# Patient Record
Sex: Female | Born: 1970 | Race: Black or African American | Hispanic: No | Marital: Single | Smoking: Never smoker
Health system: Southern US, Community
[De-identification: ages and names within clinical notes are randomized; demographics above are authoritative.]

## PROBLEM LIST (undated history)

## (undated) DIAGNOSIS — G459 Transient cerebral ischemic attack, unspecified: Secondary | ICD-10-CM

## (undated) DIAGNOSIS — R569 Unspecified convulsions: Secondary | ICD-10-CM

## (undated) DIAGNOSIS — E7800 Pure hypercholesterolemia, unspecified: Secondary | ICD-10-CM

## (undated) DIAGNOSIS — R519 Headache, unspecified: Secondary | ICD-10-CM

## (undated) DIAGNOSIS — I639 Cerebral infarction, unspecified: Secondary | ICD-10-CM

## (undated) DIAGNOSIS — R51 Headache: Secondary | ICD-10-CM

## (undated) DIAGNOSIS — G4730 Sleep apnea, unspecified: Secondary | ICD-10-CM

## (undated) DIAGNOSIS — I10 Essential (primary) hypertension: Secondary | ICD-10-CM

## (undated) HISTORY — PX: SHOULDER ARTHROSCOPY W/ ROTATOR CUFF REPAIR: SHX2400

## (undated) HISTORY — DX: Sleep apnea, unspecified: G47.30

## (undated) HISTORY — DX: Unspecified convulsions: R56.9

## (undated) HISTORY — DX: Cerebral infarction, unspecified: I63.9

---

## 2004-08-16 ENCOUNTER — Ambulatory Visit: Payer: Self-pay | Admitting: Family Medicine

## 2005-02-11 ENCOUNTER — Ambulatory Visit: Payer: Self-pay | Admitting: Family Medicine

## 2005-02-11 LAB — CONVERTED CEMR LAB: Pap Smear: NORMAL

## 2005-03-11 ENCOUNTER — Emergency Department (HOSPITAL_COMMUNITY): Admission: EM | Admit: 2005-03-11 | Discharge: 2005-03-11 | Payer: Self-pay | Admitting: Emergency Medicine

## 2005-09-24 ENCOUNTER — Inpatient Hospital Stay (HOSPITAL_COMMUNITY): Admission: AD | Admit: 2005-09-24 | Discharge: 2005-09-25 | Payer: Self-pay | Admitting: *Deleted

## 2005-09-25 ENCOUNTER — Ambulatory Visit: Payer: Self-pay | Admitting: Family Medicine

## 2005-09-26 ENCOUNTER — Ambulatory Visit (HOSPITAL_COMMUNITY): Admission: RE | Admit: 2005-09-26 | Discharge: 2005-09-26 | Payer: Self-pay | Admitting: Family Medicine

## 2005-09-29 ENCOUNTER — Emergency Department (HOSPITAL_COMMUNITY): Admission: EM | Admit: 2005-09-29 | Discharge: 2005-09-29 | Payer: Self-pay | Admitting: Emergency Medicine

## 2005-10-02 ENCOUNTER — Ambulatory Visit: Payer: Self-pay | Admitting: Family Medicine

## 2005-10-14 ENCOUNTER — Ambulatory Visit: Payer: Self-pay | Admitting: *Deleted

## 2005-12-20 ENCOUNTER — Ambulatory Visit: Payer: Self-pay | Admitting: Family Medicine

## 2005-12-23 ENCOUNTER — Ambulatory Visit: Payer: Self-pay | Admitting: Family Medicine

## 2006-01-21 ENCOUNTER — Ambulatory Visit: Payer: Self-pay | Admitting: Family Medicine

## 2008-02-13 ENCOUNTER — Emergency Department (HOSPITAL_COMMUNITY): Admission: EM | Admit: 2008-02-13 | Discharge: 2008-02-13 | Payer: Self-pay | Admitting: Emergency Medicine

## 2008-03-29 ENCOUNTER — Encounter (INDEPENDENT_AMBULATORY_CARE_PROVIDER_SITE_OTHER): Payer: Self-pay | Admitting: Family Medicine

## 2008-11-01 ENCOUNTER — Ambulatory Visit: Payer: Self-pay | Admitting: Family Medicine

## 2008-11-01 DIAGNOSIS — I10 Essential (primary) hypertension: Secondary | ICD-10-CM | POA: Insufficient documentation

## 2008-11-01 LAB — CONVERTED CEMR LAB
Bilirubin Urine: NEGATIVE
Glucose, Urine, Semiquant: NEGATIVE
Ketones, urine, test strip: NEGATIVE
Nitrite: NEGATIVE
Protein, U semiquant: NEGATIVE
Specific Gravity, Urine: 1.015
Urobilinogen, UA: 0.2
pH: 7

## 2008-11-02 ENCOUNTER — Encounter (INDEPENDENT_AMBULATORY_CARE_PROVIDER_SITE_OTHER): Payer: Self-pay | Admitting: Family Medicine

## 2008-12-12 ENCOUNTER — Encounter (INDEPENDENT_AMBULATORY_CARE_PROVIDER_SITE_OTHER): Payer: Self-pay | Admitting: Family Medicine

## 2008-12-12 ENCOUNTER — Ambulatory Visit: Payer: Self-pay | Admitting: Family Medicine

## 2008-12-12 DIAGNOSIS — N926 Irregular menstruation, unspecified: Secondary | ICD-10-CM

## 2008-12-12 LAB — CONVERTED CEMR LAB
ALT: 10 units/L (ref 0–35)
AST: 20 units/L (ref 0–37)
Albumin: 4.4 g/dL (ref 3.5–5.2)
Alkaline Phosphatase: 74 units/L (ref 39–117)
BUN: 11 mg/dL (ref 6–23)
Basophils Absolute: 0.1 10*3/uL (ref 0.0–0.1)
Basophils Relative: 1 % (ref 0–1)
CO2: 19 meq/L (ref 19–32)
Calcium: 9.6 mg/dL (ref 8.4–10.5)
Chlamydia, DNA Probe: NEGATIVE
Chloride: 99 meq/L (ref 96–112)
Cholesterol: 212 mg/dL — ABNORMAL HIGH (ref 0–200)
Creatinine, Ser: 0.74 mg/dL (ref 0.40–1.20)
Eosinophils Absolute: 0.2 10*3/uL (ref 0.0–0.7)
Eosinophils Relative: 2 % (ref 0–5)
GC Probe Amp, Genital: NEGATIVE
Glucose, Bld: 63 mg/dL — ABNORMAL LOW (ref 70–99)
Glucose, Urine, Semiquant: NEGATIVE
HCT: 39.3 % (ref 36.0–46.0)
HDL: 57 mg/dL (ref >39)
Hemoglobin: 13.7 g/dL (ref 12.0–15.0)
Ketones, urine, test strip: NEGATIVE
LDL Cholesterol: 143 mg/dL — ABNORMAL HIGH (ref 0–99)
Lymphocytes Relative: 33 % (ref 12–46)
Lymphs Abs: 3.3 10*3/uL (ref 0.7–4.0)
MCHC: 34.9 g/dL (ref 30.0–36.0)
MCV: 82.7 fL (ref 78.0–100.0)
Monocytes Absolute: 0.7 10*3/uL (ref 0.1–1.0)
Monocytes Relative: 7 % (ref 3–12)
Neutro Abs: 5.8 10*3/uL (ref 1.7–7.7)
Neutrophils Relative %: 57 % (ref 43–77)
Nitrite: NEGATIVE
Platelets: 330 10*3/uL (ref 150–400)
Potassium: 4.2 meq/L (ref 3.5–5.3)
Protein, U semiquant: 30
RBC: 4.75 M/uL (ref 3.87–5.11)
RDW: 15 % (ref 11.5–15.5)
Sodium: 138 meq/L (ref 135–145)
Specific Gravity, Urine: 1.015
TSH: 0.797 microintl units/mL (ref 0.350–4.50)
Total Bilirubin: 0.5 mg/dL (ref 0.3–1.2)
Total CHOL/HDL Ratio: 3.7
Total Protein: 8.1 g/dL (ref 6.0–8.3)
Triglycerides: 62 mg/dL (ref <150)
Urobilinogen, UA: 0.2
VLDL: 12 mg/dL (ref 0–40)
WBC: 10.1 10*3/uL (ref 4.0–10.5)
pH: 6.5

## 2009-01-10 ENCOUNTER — Encounter (INDEPENDENT_AMBULATORY_CARE_PROVIDER_SITE_OTHER): Payer: Self-pay | Admitting: Family Medicine

## 2009-02-11 ENCOUNTER — Emergency Department (HOSPITAL_COMMUNITY): Admission: EM | Admit: 2009-02-11 | Discharge: 2009-02-11 | Payer: Self-pay | Admitting: Emergency Medicine

## 2009-02-23 ENCOUNTER — Emergency Department (HOSPITAL_COMMUNITY): Admission: EM | Admit: 2009-02-23 | Discharge: 2009-02-23 | Payer: Self-pay | Admitting: Family Medicine

## 2009-04-14 ENCOUNTER — Ambulatory Visit: Payer: Self-pay | Admitting: Family Medicine

## 2009-05-03 ENCOUNTER — Encounter: Payer: Self-pay | Admitting: *Deleted

## 2009-06-15 ENCOUNTER — Ambulatory Visit: Payer: Self-pay | Admitting: Vascular Surgery

## 2009-06-15 ENCOUNTER — Ambulatory Visit (HOSPITAL_COMMUNITY): Admission: RE | Admit: 2009-06-15 | Discharge: 2009-06-15 | Payer: Self-pay | Admitting: Family Medicine

## 2009-06-15 ENCOUNTER — Encounter (INDEPENDENT_AMBULATORY_CARE_PROVIDER_SITE_OTHER): Payer: Self-pay | Admitting: Family Medicine

## 2009-06-15 ENCOUNTER — Emergency Department (HOSPITAL_COMMUNITY): Admission: EM | Admit: 2009-06-15 | Discharge: 2009-06-15 | Payer: Self-pay | Admitting: Family Medicine

## 2009-08-27 ENCOUNTER — Emergency Department (HOSPITAL_COMMUNITY): Admission: EM | Admit: 2009-08-27 | Discharge: 2009-08-27 | Payer: Self-pay | Admitting: Emergency Medicine

## 2009-10-31 ENCOUNTER — Ambulatory Visit: Payer: Self-pay | Admitting: Physician Assistant

## 2009-10-31 DIAGNOSIS — K3189 Other diseases of stomach and duodenum: Secondary | ICD-10-CM

## 2009-10-31 DIAGNOSIS — R1013 Epigastric pain: Secondary | ICD-10-CM

## 2009-10-31 DIAGNOSIS — R42 Dizziness and giddiness: Secondary | ICD-10-CM

## 2009-11-15 ENCOUNTER — Ambulatory Visit: Payer: Self-pay | Admitting: Physician Assistant

## 2009-11-15 LAB — CONVERTED CEMR LAB
BUN: 9 mg/dL (ref 6–23)
Beta hcg, urine, semiquantitative: NEGATIVE
CO2: 25 meq/L (ref 19–32)
Calcium: 9.4 mg/dL (ref 8.4–10.5)
Chloride: 101 meq/L (ref 96–112)
Creatinine, Ser: 0.85 mg/dL (ref 0.40–1.20)
Glucose, Bld: 98 mg/dL (ref 70–99)
Potassium: 4.5 meq/L (ref 3.5–5.3)
Sodium: 139 meq/L (ref 135–145)

## 2009-11-20 ENCOUNTER — Encounter: Payer: Self-pay | Admitting: Physician Assistant

## 2009-12-20 ENCOUNTER — Emergency Department (HOSPITAL_COMMUNITY): Admission: EM | Admit: 2009-12-20 | Discharge: 2009-12-20 | Payer: Self-pay | Admitting: Emergency Medicine

## 2009-12-21 ENCOUNTER — Ambulatory Visit: Payer: Self-pay | Admitting: Physician Assistant

## 2009-12-21 DIAGNOSIS — R519 Headache, unspecified: Secondary | ICD-10-CM | POA: Insufficient documentation

## 2009-12-21 DIAGNOSIS — M79609 Pain in unspecified limb: Secondary | ICD-10-CM

## 2010-01-16 ENCOUNTER — Ambulatory Visit: Payer: Self-pay | Admitting: Physician Assistant

## 2010-01-16 DIAGNOSIS — R82998 Other abnormal findings in urine: Secondary | ICD-10-CM

## 2010-01-16 LAB — CONVERTED CEMR LAB
Bilirubin Urine: NEGATIVE
Glucose, Urine, Semiquant: NEGATIVE
KOH Prep: NEGATIVE
Ketones, urine, test strip: NEGATIVE
Nitrite: NEGATIVE
Pap Smear: NEGATIVE
Protein, U semiquant: 30
Specific Gravity, Urine: >=1.03
Urobilinogen, UA: 0.2
WBC Urine, dipstick: NEGATIVE
Whiff Test: NEGATIVE
pH: 6

## 2010-01-17 ENCOUNTER — Encounter: Payer: Self-pay | Admitting: Physician Assistant

## 2010-01-18 LAB — CONVERTED CEMR LAB
Casts: NONE SEEN /lpf
Chlamydia, DNA Probe: NEGATIVE
Crystals: NONE SEEN
GC Probe Amp, Genital: NEGATIVE

## 2010-01-19 ENCOUNTER — Encounter: Payer: Self-pay | Admitting: Physician Assistant

## 2010-02-13 ENCOUNTER — Ambulatory Visit: Payer: Self-pay | Admitting: Physician Assistant

## 2010-02-14 ENCOUNTER — Encounter: Payer: Self-pay | Admitting: Physician Assistant

## 2010-02-14 LAB — CONVERTED CEMR LAB
Cholesterol, target level: 200 mg/dL
Cholesterol: 195 mg/dL (ref 0–200)
HDL goal, serum: 40 mg/dL
HDL: 57 mg/dL (ref >39)
LDL Cholesterol: 124 mg/dL — ABNORMAL HIGH (ref 0–99)
LDL Goal: 130 mg/dL
Total CHOL/HDL Ratio: 3.4
Triglycerides: 70 mg/dL (ref <150)
VLDL: 14 mg/dL (ref 0–40)
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
## 2010-09-19 ENCOUNTER — Emergency Department (HOSPITAL_COMMUNITY)
Admission: EM | Admit: 2010-09-19 | Discharge: 2010-09-19 | Payer: Self-pay | Source: Home / Self Care | Admitting: Family Medicine

## 2010-11-26 ENCOUNTER — Emergency Department (HOSPITAL_COMMUNITY)
Admission: EM | Admit: 2010-11-26 | Discharge: 2010-11-26 | Payer: Self-pay | Source: Home / Self Care | Admitting: Emergency Medicine

## 2010-11-29 ENCOUNTER — Ambulatory Visit: Admit: 2010-11-29 | Payer: Self-pay | Admitting: Nurse Practitioner

## 2010-11-29 ENCOUNTER — Telehealth (INDEPENDENT_AMBULATORY_CARE_PROVIDER_SITE_OTHER): Payer: Self-pay | Admitting: Nurse Practitioner

## 2010-12-03 LAB — URINALYSIS, ROUTINE W REFLEX MICROSCOPIC
Bilirubin Urine: NEGATIVE
Ketones, ur: NEGATIVE mg/dL
Nitrite: NEGATIVE
Protein, ur: NEGATIVE mg/dL
Specific Gravity, Urine: 1.021 (ref 1.005–1.030)
Urine Glucose, Fasting: NEGATIVE mg/dL
Urobilinogen, UA: 0.2 mg/dL (ref 0.0–1.0)
pH: 5.5 (ref 5.0–8.0)

## 2010-12-03 LAB — POCT PREGNANCY, URINE: Preg Test, Ur: NEGATIVE

## 2010-12-03 LAB — URINE MICROSCOPIC-ADD ON

## 2010-12-03 LAB — CBC
HCT: 42.5 % (ref 36.0–46.0)
Hemoglobin: 14.9 g/dL (ref 12.0–15.0)
MCH: 30 pg (ref 26.0–34.0)
MCHC: 35.1 g/dL (ref 30.0–36.0)
MCV: 85.5 fL (ref 78.0–100.0)
Platelets: 279 10*3/uL (ref 150–400)
RBC: 4.97 MIL/uL (ref 3.87–5.11)
RDW: 13.2 % (ref 11.5–15.5)
WBC: 5.7 10*3/uL (ref 4.0–10.5)

## 2010-12-03 LAB — POCT I-STAT, CHEM 8
BUN: 6 mg/dL (ref 6–23)
Calcium, Ion: 1.09 mmol/L — ABNORMAL LOW (ref 1.12–1.32)
Chloride: 104 mEq/L (ref 96–112)
Creatinine, Ser: 0.8 mg/dL (ref 0.4–1.2)
Glucose, Bld: 96 mg/dL (ref 70–99)
HCT: 45 % (ref 36.0–46.0)
Hemoglobin: 15.3 g/dL — ABNORMAL HIGH (ref 12.0–15.0)
Potassium: 3.5 mEq/L (ref 3.5–5.1)
Sodium: 136 mEq/L (ref 135–145)
TCO2: 24 mmol/L (ref 0–100)

## 2010-12-03 LAB — DIFFERENTIAL
Basophils Absolute: 0 10*3/uL (ref 0.0–0.1)
Basophils Relative: 0 % (ref 0–1)
Eosinophils Absolute: 0 10*3/uL (ref 0.0–0.7)
Eosinophils Relative: 0 % (ref 0–5)
Lymphocytes Relative: 21 % (ref 12–46)
Lymphs Abs: 1.2 10*3/uL (ref 0.7–4.0)
Monocytes Absolute: 0.4 10*3/uL (ref 0.1–1.0)
Monocytes Relative: 7 % (ref 3–12)
Neutro Abs: 4.1 10*3/uL (ref 1.7–7.7)
Neutrophils Relative %: 71 % (ref 43–77)

## 2010-12-03 LAB — WET PREP, GENITAL
Trich, Wet Prep: NONE SEEN
Yeast Wet Prep HPF POC: NONE SEEN

## 2010-12-19 ENCOUNTER — Other Ambulatory Visit (HOSPITAL_COMMUNITY): Payer: Self-pay | Admitting: Family Medicine

## 2010-12-19 ENCOUNTER — Inpatient Hospital Stay (INDEPENDENT_AMBULATORY_CARE_PROVIDER_SITE_OTHER)
Admission: RE | Admit: 2010-12-19 | Discharge: 2010-12-19 | Disposition: A | Discharge status: Home / Self Care | Payer: Self-pay | Source: Ambulatory Visit | Attending: Family Medicine | Admitting: Family Medicine

## 2010-12-19 ENCOUNTER — Ambulatory Visit (HOSPITAL_COMMUNITY)
Admission: RE | Admit: 2010-12-19 | Discharge: 2010-12-19 | Disposition: A | Discharge status: Home / Self Care | Payer: Self-pay | Source: Ambulatory Visit | Attending: Emergency Medicine | Admitting: Emergency Medicine

## 2010-12-19 DIAGNOSIS — M25579 Pain in unspecified ankle and joints of unspecified foot: Secondary | ICD-10-CM | POA: Insufficient documentation

## 2010-12-19 DIAGNOSIS — S99911A Unspecified injury of right ankle, initial encounter: Secondary | ICD-10-CM

## 2010-12-19 DIAGNOSIS — M79609 Pain in unspecified limb: Secondary | ICD-10-CM
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
## 2010-12-20 NOTE — Letter (Signed)
Summary: Lipid Letter  [facility]  [address] [place], [place] [address]   Phone: [phone]  Fax: [phone]    02/14/2010  Sherry Nicholson [address] [place], [place]  [address]  Dear Sherry Nicholson:  We have carefully reviewed your last lipid profile from 02/13/2010 and the results are noted below with a summary of recommendations for lipid management.    Cholesterol:       195     Goal: <200   HDL "good" Cholesterol:   57     Goal: >40   LDL "bad" Cholesterol:   124     Goal: <130   Triglycerides:       70     Goal: <150    TLC Diet (Therapeutic Lifestyle Change): Saturated Fats & Transfatty acids should be kept < 7% of total calories  Polyunstaurated Fat can be up to 10% of total calories Monounsaturated Fat Fat can be up to 20% of total calories Total Fat should be no greater than 25-35% of total calories Carbohydrates should be 50-60% of total calories Protein should be approximately 15% of total calories Fiber should be at least 20-30 grams a day ***Increased fiber may help lower LDL Total Cholesterol should be < 200mg /day Consider adding plant stanol/sterols to diet (example: Benacol spread) ***A higher intake of unsaturated fat may reduce Triglycerides and Increase HDL    Adjunctive Measures (may lower LIPIDS and reduce risk of Heart Attack) include: Aerobic Exercise (20-30 minutes 3-4 times a week) Limit Alcohol Consumption Weight Reduction Dietary Fiber 20-30 grams a day by mouth    Current Medications: 1)    Hydrochlorothiazide 25 Mg Tabs (Hydrochlorothiazide) .... Take 1 tablet by mouth every morning 2)    Pepcid 20 Mg Tabs (Famotidine) .... Take 1 tablet by mouth two times a day as needed for indigestion 3)    Desogen 0.15-30 Mg-mcg Tabs (Desogestrel-ethinyl estradiol) .... As directed  You are at goal with your cholesterol.  Try some of the above recommendations so we can keep it that way and possibly make your numbers look better.    Sincerely,    [facility] Tereso Newcomer PA-C

## 2010-12-20 NOTE — Letter (Signed)
Summary: *HSN Results Follow up  [facility]  [address] [place], [place] [address]   Phone: [phone]  Fax: [phone]      11/20/2009   [facility] [address] [place], [place]  [address]   Dear  Ms. [facility],                            ____S.Drinkard,FNP   ____D. Gore,FNP       ____B. McPherson,MD   ____V. Rankins,MD    ____E. Mulberry,MD    ____N. Daphine Deutscher, FNP  ____D. Reche Dixon, MD    ____K. Philipp Deputy, MD    __x__S. Alben Spittle, PA-C     This letter is to inform you that your recent test(s):  _______Pap Smear    ___x____Lab Test     _______X-ray    ___x____ is within acceptable limits  _______ requires a medication change  _______ requires a follow-up lab visit  _______ requires a follow-up visit with your provider   Comments:       _________________________________________________________ If you have any questions, please contact our office                     Sincerely,  Tereso Newcomer PA-C [facility]

## 2010-12-20 NOTE — Assessment & Plan Note (Signed)
Summary: FU FOR CPP/////KT   Vital Signs:  Patient profile:   40 year old female Menstrual status:  irregular LMP:     01/01/2010 Weight:      221.0 pounds Temp:     98.0 degrees F oral Pulse rate:   72 / minute Pulse rhythm:   regular Resp:     19 per minute BP sitting:   143 / 82  (left arm) Cuff size:   regular  Vitals Entered By: Mikey College CMA (January 16, 2010 4:07 PM) CC: Pt here for CPP., Hypertension Management Is Patient Diabetic? No Pain Assessment Patient in pain? no       Does patient need assistance? Functional Status Self care Ambulation Normal LMP (date): 01/01/2010 LMP - Character: light Menarche (age onset years): 15    Menstrual flow (days): 7-8 Enter LMP: 01/01/2010 Last PAP Result NEGATIVE FOR INTRAEPITHELIAL LESIONS OR MALIGNANCY.   Primary Care Provider:  Tereso Newcomer PA-C  CC:  Pt here for CPP. and Hypertension Management.  History of Present Illness: Here for CPP.  Leg pain resolved.  Never got xray. No abnormal paps in the past. Has a h/o irregular periods.  Was taking OCPs.  This was helping.  She has been out for a while with troubles with our pharmacy. No vaginal discharge or odor. No FHx breast or ovarian cancer. Not taking calcium.  Hypertension History:      She denies chest pain, dyspnea with exertion, and syncope.  She notes no problems with any antihypertensive medication side effects.  Further comments include: no medicine today; no meds this week.        Positive major cardiovascular risk factors include hypertension and family history for ischemic heart disease (females less than 49 years old).  Negative major cardiovascular risk factors include female age less than 52 years old and non-tobacco-user status.     Problems Prior to Update: 1)  Leg Pain  (ICD-729.5) 2)  Dizziness  (ICD-780.4) 3)  Dyspepsia  (ICD-536.8) 4)  Irregular Menses  (ICD-626.4) 5)  Screening For Malignant Neoplasm, Cervix  (ICD-V76.2) 6)   Examination, Routine Medical  (ICD-V70.0) 7)  Essential Hypertension  (ICD-401.9)  Allergies (verified): 1)  ! 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 Father living HTN Mother living arthritis Sister living HTN Sister DM    Social History: Works in childcare. G2P2 Never Smoked Alcohol use-no Drug use-no In relationship. Single sexually active with one partner  Review of Systems  The patient denies fever, weight loss, chest pain, syncope, dyspnea on exertion, peripheral edema, headaches, hemoptysis, melena, hematochezia, severe indigestion/heartburn, hematuria, depression, and abnormal bleeding.    Physical Exam  General:  alert, well-developed, and well-nourished.   Head:  normocephalic and atraumatic.   Eyes:  pupils equal, pupils round, pupils reactive to light, and no retinal abnormalitiies.   Ears:  R ear normal and L ear normal.   Nose:  no external deformity.   Neck:  supple, no thyromegaly, and no cervical lymphadenopathy.   Breasts:  skin/areolae normal, no masses, no abnormal thickening, no nipple discharge, no tenderness, and no adenopathy.   Lungs:  normal breath sounds, no crackles, and no wheezes.   Heart:  normal rate, regular rhythm, and no murmur.   Abdomen:  soft, non-tender, and no hepatomegaly.   Rectal:  no external abnormalities.   Genitalia:  normal introitus, no external lesions, no vaginal discharge, mucosa pink and moist, no vaginal or cervical lesions, no vaginal atrophy, no friaility or hemorrhage, normal uterus size and position, and no adnexal masses or tenderness.   Msk:  normal ROM.   Pulses:  2+ DP/PT Extremities:  no  edema Neurologic:  alert & oriented X3 and cranial nerves II-XII intact.   Skin:  turgor normal.   small keloid post right shoulder Psych:  normally interactive.     Impression & Recommendations:  Problem # 1:  EXAMINATION, ROUTINE MEDICAL (ICD-V70.0)  refuses flu shot  Orders: KOH/ WET [place] [phone]) T- GC Chlamydia (98119) T-Pap Smear, Thin Prep ([address]782) T-Syphilis Test (RPR) [phone]) T-HIV Antibody  (Reflex) [id number])  Problem # 2:  URINALYSIS, ABNORMAL (ICD-791.9)  Orders: T-Culture, Urine ([id number]) T- * Misc. Laboratory test [id number])  Problem # 3:  ESSENTIAL HYPERTENSION (ICD-401.9) restart meds  Her updated medication list for this problem includes:    Hydrochlorothiazide 25 Mg Tabs (Hydrochlorothiazide) .Marland Kitchen... Take 1 tablet by mouth every morning  Problem # 4:  IRREGULAR MENSES (ICD-626.4) get back on OCPs  The following medications were removed from the medication list:    Desogen 0.15-30 Mg-mcg Tabs (Desogestrel-ethinyl estradiol) .Marland Kitchen... Take 1 tablet by mouth once a day Her updated medication list for this problem includes:    Desogen 0.15-30 Mg-mcg Tabs (Desogestrel-ethinyl estradiol) .Marland Kitchen... As directed  Complete Medication List: 1)  Hydrochlorothiazide 25 Mg Tabs (Hydrochlorothiazide) .... Take 1 tablet by mouth every morning 2)  Pepcid 20 Mg Tabs (Famotidine) .... Take 1 tablet by mouth two times a day as needed for indigestion 3)  Desogen 0.15-30 Mg-mcg Tabs (Desogestrel-ethinyl estradiol) .... As directed  Hypertension Assessment/Plan:      The patient's hypertensive risk group is category B: At least one risk factor (excluding diabetes) with no target organ damage.  Her calculated 10 year risk of coronary heart disease is 2 %.  Today's blood pressure is 143/82.  Her blood pressure goal is < 140/90.  Patient Instructions: 1)  Schedule lab visit for Lipids in next 2-4 weeks.  Come fasting (nothing to eat or drink after midnight except water). Dx  V70.0 2)  Take calcium 600 mg + Vit D 400 International Units two times a day. 3)  Restart blood pressure medicine.  Come in for blood pressure check with the nurse 2 weeks after restarting.  Can do at lab visit. 4)  Please schedule a follow-up appointment in 3 months with Gerturde Kuba for blood pressure.  Prescriptions: DESOGEN 0.15-30 MG-MCG TABS (DESOGESTREL-ETHINYL ESTRADIOL) as directed  #1 month x 11   Entered and Authorized by:   Tereso Newcomer PA-C   Signed by:   Tereso Newcomer PA-C on 01/16/2010   Method used:   Print then Give to Patient   RxID:   [id number]   Laboratory Results   Urine Tests  Date/Time Received: January 16, 2010 4:38 PM   Routine Urinalysis   Glucose: negative   (Normal Range: Negative) Bilirubin: negative   (Normal Range: Negative) Ketone: negative   (Normal Range: Negative) Spec. Gravity: >=1.030   (Normal Range: 1.003-1.035) Blood: trace-intact   (Normal Range: Negative) pH: 6.0   (Normal Range: 5.0-8.0) Protein: 30   (Normal Range: Negative) Urobilinogen: 0.2   (Normal Range: 0-1) Nitrite: negative   (Normal Range: Negative) Leukocyte Esterace: negative   (Normal Range: Negative)  [employer] Source: vaginal WBC/hpf: 1-5 Bacteria/hpf: rare Clue cells/hpf: few  Negative whiff Yeast/hpf: none Wet [place] KOH: Negative Trichomonas/hpf: none    Appended Document: FU FOR CPP/////KT     Allergies: 1)  ! Cortisone Acetate (Cortisone Acetate)   Complete Medication List: 1)  Hydrochlorothiazide 25 Mg Tabs (Hydrochlorothiazide) .... Take 1 tablet by mouth every morning 2)  Pepcid 20 Mg Tabs (Famotidine) .... Take 1 tablet by mouth two times a day as needed for indigestion 3)  Desogen 0.15-30 Mg-mcg Tabs (Desogestrel-ethinyl estradiol) .... As directed   Laboratory Results  Date/Time Received: January 16, 2010 5:39 PM  Date/Time Reported: January 16, 2010 5:39 PM   Other Tests  Rapid HIV: negative

## 2010-12-20 NOTE — Letter (Signed)
Summary: *HSN Results Follow up  [facility]  [address] [place], [place] [address]   Phone: [phone]  Fax: [phone]      01/19/2010   Kaitlynne Schwertner [address] [place], [place]  [address]   Dear  Ms. [facility],                            ____S.Drinkard,FNP   ____D. Gore,FNP       ____B. McPherson,MD   ____V. Rankins,MD    ____E. Mulberry,MD    ____N. Daphine Deutscher, FNP  ____D. Reche Dixon, MD    ____K. Philipp Deputy, MD    __x__S. Alben Spittle, PA-C     This letter is to inform you that your recent test(s):  ___x___Pap Smear    _______Lab Test     _______X-ray    __x_____ is within acceptable limits  _______ requires a medication change  _______ requires a follow-up lab visit  _______ requires a follow-up visit with your provider   Comments:       _________________________________________________________ If you have any questions, please contact our office                     Sincerely,  Tereso Newcomer PA-C [facility]

## 2010-12-20 NOTE — Progress Notes (Signed)
Summary: Office Visit//DEPRESSION SCREENING  Office Visit//DEPRESSION SCREENING   Imported By: Arta Bruce 03/23/2010 12:44:22  _____________________________________________________________________  External Attachment:    Type:   Image     Comment:   External Document

## 2010-12-20 NOTE — Progress Notes (Signed)
Summary: Diagnoised with PID/ needs refills  Phone Note Call from Patient Call back at Home Phone [phone]   Reason for Call: Refill Medication Summary of Call: patients birthcontrol needs a refill but the brand she takes.Desogen, [facility], they are subsututing it for [place] and she needs [place] to call it in today. Ms Clodfelter says that Saturday she was having real ba d pain and on "Sunday she went to the er and told her she has a pelviic inflamatory disease, they gave her shot and her boyfriend went the next day and was given a shot and four pills. She wants to know if she will need to follow up and to make sure everything is ok and when will it be ok to have intercourse again. Initial call taken by: Kimberly Tinnin,  November 29, 2010 2:24 PM  Follow-up for Phone Call        Feeling much better, denies any pain.  Still has a little clear discharge - only symptom.  Wants to know if there is any other medication she needs to take before resuming sexual relations?  If not, when can she start back up?  Does she need to be seen here first?  Has appt. with you  01/04/11 for premenopause, vaginal bleeding.   ED records in your refill rack. Follow-up by: Theresa Laib RN,  November 29, 2010 2:49 PM  Additional Follow-up for Phone Call Additional follow up Details #1::        Will refill birth control pills for 3 refills.  She needs a CPE with PAP. At that time pt can be retested for any bacterial or infection.  She can resume sexual activity in 1 week. Perhaps she should use condoms as with PID there is likely to be some vaginal irritation and lower abdominal discomfort Additional Follow-up by: Jacquelene Kopecky Martin FNP,  November 30, 2010 8:12 AM    Additional Follow-up for Phone Call Additional follow up Details #2::    Left message on answering machine for pt. to return call.  Theresa Laib RN  November 30, 2010 9:17 AM  PT RETURNING YOUR CALL PLEASE, CALL HER @ [phone] THANK YOU ..Nora  Soler  November 30, 2010 9:53 AM  Advised of refills and provider's response -- CPP appt. 01/30/11.  Theresa Laib RN  November 30, 2010 6:31 PM   Prescriptions: DESOGEN 0.15-30 MG-MCG TABS (DESOGESTREL-ETHINYL ESTRADIOL) as directed  #1 month x 2   Entered and Authorized by:   Kanda Deluna Martin FNP   Signed by:   Theresa Laib RN on 11/30/2010   Method used:   Faxed to ...       [facility] (retail)       [address] [place], [place]  [address]       Ph: [phone]       Fax: [phone]   RxID:   [id number]

## 2011-02-15 ENCOUNTER — Other Ambulatory Visit: Payer: Self-pay | Admitting: Physician Assistant

## 2011-02-15 NOTE — Telephone Encounter (Signed)
S/w pharmacy today and advised SW is no longer w/HS and to please contact [facility] for medication.

## 2011-02-26 ENCOUNTER — Other Ambulatory Visit: Payer: Self-pay | Admitting: Physician Assistant

## 2011-03-08 ENCOUNTER — Other Ambulatory Visit: Payer: Self-pay | Admitting: Nurse Practitioner

## 2011-06-11 ENCOUNTER — Inpatient Hospital Stay (INDEPENDENT_AMBULATORY_CARE_PROVIDER_SITE_OTHER)
Admission: RE | Admit: 2011-06-11 | Discharge: 2011-06-11 | Disposition: A | Discharge status: Home / Self Care | Payer: Self-pay | Source: Ambulatory Visit | Attending: Emergency Medicine | Admitting: Emergency Medicine

## 2011-06-11 ENCOUNTER — Ambulatory Visit (INDEPENDENT_AMBULATORY_CARE_PROVIDER_SITE_OTHER): Payer: Self-pay

## 2011-06-11 DIAGNOSIS — S93409A Sprain of unspecified ligament of unspecified ankle, initial encounter: Secondary | ICD-10-CM

## 2013-05-31 ENCOUNTER — Other Ambulatory Visit (HOSPITAL_COMMUNITY)
Admission: RE | Admit: 2013-05-31 | Discharge: 2013-05-31 | Disposition: A | Discharge status: Home / Self Care | Payer: BC Managed Care – PPO | Source: Ambulatory Visit | Attending: Family Medicine | Admitting: Family Medicine

## 2013-05-31 ENCOUNTER — Other Ambulatory Visit: Payer: Self-pay | Admitting: Family Medicine

## 2013-05-31 DIAGNOSIS — Z113 Encounter for screening for infections with a predominantly sexual mode of transmission: Secondary | ICD-10-CM | POA: Insufficient documentation

## 2013-05-31 DIAGNOSIS — Z01419 Encounter for gynecological examination (general) (routine) without abnormal findings: Secondary | ICD-10-CM | POA: Insufficient documentation

## 2014-01-21 ENCOUNTER — Encounter (HOSPITAL_COMMUNITY): Payer: Self-pay | Admitting: Emergency Medicine

## 2014-01-21 ENCOUNTER — Observation Stay (HOSPITAL_COMMUNITY)
Admission: EM | Admit: 2014-01-21 | Discharge: 2014-01-24 | Disposition: A | Discharge status: Home / Self Care | Payer: Self-pay | Attending: Internal Medicine | Admitting: Internal Medicine

## 2014-01-21 ENCOUNTER — Emergency Department (HOSPITAL_COMMUNITY): Payer: Self-pay

## 2014-01-21 ENCOUNTER — Observation Stay (HOSPITAL_COMMUNITY): Payer: Self-pay

## 2014-01-21 DIAGNOSIS — R51 Headache: Secondary | ICD-10-CM | POA: Insufficient documentation

## 2014-01-21 DIAGNOSIS — K3189 Other diseases of stomach and duodenum: Secondary | ICD-10-CM

## 2014-01-21 DIAGNOSIS — Z91018 Allergy to other foods: Secondary | ICD-10-CM | POA: Insufficient documentation

## 2014-01-21 DIAGNOSIS — G459 Transient cerebral ischemic attack, unspecified: Secondary | ICD-10-CM | POA: Diagnosis present

## 2014-01-21 DIAGNOSIS — E785 Hyperlipidemia, unspecified: Secondary | ICD-10-CM | POA: Diagnosis present

## 2014-01-21 DIAGNOSIS — R55 Syncope and collapse: Principal | ICD-10-CM

## 2014-01-21 DIAGNOSIS — I10 Essential (primary) hypertension: Secondary | ICD-10-CM | POA: Diagnosis present

## 2014-01-21 DIAGNOSIS — N926 Irregular menstruation, unspecified: Secondary | ICD-10-CM

## 2014-01-21 DIAGNOSIS — Z888 Allergy status to other drugs, medicaments and biological substances status: Secondary | ICD-10-CM | POA: Insufficient documentation

## 2014-01-21 DIAGNOSIS — R82998 Other abnormal findings in urine: Secondary | ICD-10-CM

## 2014-01-21 DIAGNOSIS — R42 Dizziness and giddiness: Secondary | ICD-10-CM

## 2014-01-21 DIAGNOSIS — R079 Chest pain, unspecified: Secondary | ICD-10-CM | POA: Insufficient documentation

## 2014-01-21 DIAGNOSIS — M79609 Pain in unspecified limb: Secondary | ICD-10-CM

## 2014-01-21 DIAGNOSIS — R1013 Epigastric pain: Secondary | ICD-10-CM

## 2014-01-21 HISTORY — DX: Transient cerebral ischemic attack, unspecified: G45.9

## 2014-01-21 HISTORY — DX: Headache, unspecified: R51.9

## 2014-01-21 HISTORY — DX: Headache: R51

## 2014-01-21 HISTORY — DX: Essential (primary) hypertension: I10

## 2014-01-21 HISTORY — DX: Pure hypercholesterolemia, unspecified: E78.00

## 2014-01-21 LAB — CBC
HCT: 39.7 % (ref 36.0–46.0)
HEMOGLOBIN: 14.2 g/dL (ref 12.0–15.0)
MCH: 30.5 pg (ref 26.0–34.0)
MCHC: 35.8 g/dL (ref 30.0–36.0)
MCV: 85.4 fL (ref 78.0–100.0)
Platelets: 228 10*3/uL (ref 150–400)
RBC: 4.65 MIL/uL (ref 3.87–5.11)
RDW: 14.1 % (ref 11.5–15.5)
WBC: 8.4 10*3/uL (ref 4.0–10.5)

## 2014-01-21 LAB — BASIC METABOLIC PANEL
BUN: 9 mg/dL (ref 6–23)
CO2: 20 meq/L (ref 19–32)
CREATININE: 0.66 mg/dL (ref 0.50–1.10)
Calcium: 9.6 mg/dL (ref 8.4–10.5)
Chloride: 104 mEq/L (ref 96–112)
GFR calc Af Amer: >90 mL/min (ref >90)
GFR calc non Af Amer: >90 mL/min (ref >90)
GLUCOSE: 87 mg/dL (ref 70–99)
Potassium: 3.8 mEq/L (ref 3.7–5.3)
SODIUM: 141 meq/L (ref 137–147)

## 2014-01-21 LAB — PREGNANCY, URINE: PREG TEST UR: NEGATIVE

## 2014-01-21 LAB — RAPID URINE DRUG SCREEN, HOSP PERFORMED
AMPHETAMINES: NOT DETECTED
BENZODIAZEPINES: NOT DETECTED
Barbiturates: NOT DETECTED
COCAINE: NOT DETECTED
Opiates: NOT DETECTED
Tetrahydrocannabinol: POSITIVE — AB

## 2014-01-21 LAB — TROPONIN I: Troponin I: <0.3 ng/mL (ref <0.30)

## 2014-01-21 MED ORDER — ASPIRIN 81 MG PO CHEW
324.0000 mg | CHEWABLE_TABLET | Freq: Once | ORAL | Status: AC
  Administered 2014-01-21: 324 mg via ORAL
  Filled 2014-01-21: qty 4

## 2014-01-21 MED ORDER — ASPIRIN 325 MG PO TABS
325.0000 mg | ORAL_TABLET | Freq: Every day | ORAL | Status: DC
  Administered 2014-01-22 – 2014-01-24 (×3): 325 mg via ORAL
  Filled 2014-01-21 (×3): qty 1

## 2014-01-21 MED ORDER — ACETAMINOPHEN 325 MG PO TABS
650.0000 mg | ORAL_TABLET | Freq: Once | ORAL | Status: AC
  Administered 2014-01-21: 650 mg via ORAL
  Filled 2014-01-21: qty 2

## 2014-01-21 MED ORDER — IOHEXOL 350 MG/ML SOLN
100.0000 mL | Freq: Once | INTRAVENOUS | Status: AC | PRN
  Administered 2014-01-21: 100 mL via INTRAVENOUS

## 2014-01-21 MED ORDER — ACETAMINOPHEN 325 MG PO TABS: 650.0000 mg | ORAL_TABLET | ORAL | Status: DC | PRN

## 2014-01-21 MED ORDER — ENOXAPARIN SODIUM 40 MG/0.4ML ~~LOC~~ SOLN
40.0000 mg | SUBCUTANEOUS | Status: DC
  Administered 2014-01-21 – 2014-01-22 (×2): 40 mg via SUBCUTANEOUS
  Filled 2014-01-21 (×4): qty 0.4

## 2014-01-21 MED ORDER — HYDRALAZINE HCL 25 MG PO TABS
25.0000 mg | ORAL_TABLET | Freq: Four times a day (QID) | ORAL | Status: DC | PRN
  Filled 2014-01-21: qty 1

## 2014-01-21 NOTE — ED Notes (Signed)
Per EMS-Pt was at work had near syncopal episode. States she felt CP, then started getting hot, got up to walk around and get something when she turned around pt had syncopal episode. Coworkers report pt was hot, sweating after syncope. Skin is warm and dry,  Pt is shaky. States CP has been intermittent and has been relieved by rubbing. BP 136/97, HR 100, CBG 95.

## 2014-01-21 NOTE — ED Notes (Signed)
Admitting MD at the bedside.  

## 2014-01-21 NOTE — ED Notes (Signed)
Dr. jacubowitz at the bedside.  

## 2014-01-21 NOTE — H&P (Signed)
PCP:   Lynne Logan, MD   Chief Complaint:  Near Syncope  HPI:  43 year old female who  has a past medical history of Hypertension and Hypercholesteremia. she has history of poorly controlled hypertension, not been on any medications. The patient came to the ED after she had an episode of chest pain at her job, as per patient she started having midsternal chest pain and then she started walking to ease off the pain, then suddenly patient fell on the ground, though she denies passing out. Patient could not speak for 10 minutes, but was able to communicate with her hands. As per patient's friend, who witnessed this episode, patient also was diaphoretic, though she did not have any weakness of extremities or seizure like activity. When the EMT arrived, patient had elevated blood pressure of 168/100.  In the ED patient continues to have elevated blood pressure, EKG shows nonspecific ST-T changes, cardiac enzymes x1 are negative. Patient's chest pain has resolved at this time, she denies smoking cigarettes, drinks occasional alcohol, she has a history of hyperlipidemia and hypertension.  Allergies:   Allergies  Allergen Reactions  . Coconut Flavor Itching  . Cortisone Acetate Itching and Rash      Past Medical History  Diagnosis Date  . Hypertension   . Hypercholesteremia     History reviewed. No pertinent past surgical history.  Prior to Admission medications   Medication Sig Start Date End Date Taking? Authorizing Provider  ibuprofen (ADVIL,MOTRIN) 200 MG tablet Take 400 mg by mouth daily as needed for headache.    Yes Historical Provider, MD    Social History:  reports that she has never smoked. She does not have any smokeless tobacco history on file. She reports that she drinks alcohol. She reports that she does not use illicit drugs.    All the positives are listed in BOLD  Review of Systems:  HEENT: Headache, blurred vision, runny nose, sore throat Neck: Hypothyroidism,  hyperthyroidism,,lymphadenopathy Chest : Shortness of breath, history of COPD, Asthma Heart : Chest pain, history of coronary arterey disease GI:  Nausea, vomiting, diarrhea, constipation, GERD GU: Dysuria, urgency, frequency of urination, hematuria Neuro: Stroke, seizures, syncope Psych: Depression, anxiety, hallucinations   Physical Exam: Blood pressure 132/80, pulse 79, temperature 97.8 F (36.6 C), temperature source Oral, resp. rate 21, height 5' 5"  (1.651 m), weight 77.111 kg (170 lb), last menstrual period 12/19/2013, SpO2 100.00%. Constitutional:   Patient is a well-developed and well-nourished female in no acute distress and cooperative with exam. Head: Normocephalic and atraumatic Mouth: Mucus membranes moist Eyes: PERRL, EOMI, conjunctivae normal Neck: Supple, No Thyromegaly Cardiovascular: RRR, S1 normal, S2 normal Pulmonary/Chest: CTAB, no wheezes, rales, or rhonchi Abdominal: Soft. Non-tender, non-distended, bowel sounds are normal, no masses, organomegaly, or guarding present.  Neurological: A&O x3, Strenght is normal and symmetric bilaterally, cranial nerve II-XII are grossly intact, no focal motor deficit, sensory intact to light touch bilaterally.  Extremities : No Cyanosis, Clubbing or Edema   Labs on Admission:  Results for orders placed during the hospital encounter of 01/21/14 (from the past 48 hour(s))  CBC     Status: None   Collection Time    01/21/14  2:29 PM      Result Value Ref Range   WBC 8.4  4.0 - 10.5 K/uL   RBC 4.65  3.87 - 5.11 MIL/uL   Hemoglobin 14.2  12.0 - 15.0 g/dL   HCT 39.7  36.0 - 46.0 %   MCV 85.4  78.0 -  100.0 fL   MCH 30.5  26.0 - 34.0 pg   MCHC 35.8  30.0 - 36.0 g/dL   RDW 14.1  11.5 - 15.5 %   Platelets 228  150 - 400 K/uL  BASIC METABOLIC PANEL     Status: None   Collection Time    01/21/14  2:29 PM      Result Value Ref Range   Sodium 141  137 - 147 mEq/L   Potassium 3.8  3.7 - 5.3 mEq/L   Chloride 104  96 - 112 mEq/L    CO2 20  19 - 32 mEq/L   Glucose, Bld 87  70 - 99 mg/dL   BUN 9  6 - 23 mg/dL   Creatinine, Ser 0.66  0.50 - 1.10 mg/dL   Calcium 9.6  8.4 - 10.5 mg/dL   GFR calc non Af Amer >90  >90 mL/min   GFR calc Af Amer >90  >90 mL/min   Comment: (NOTE)     The eGFR has been calculated using the CKD EPI equation.     This calculation has not been validated in all clinical situations.     eGFR's persistently <90 mL/min signify possible Chronic Kidney     Disease.  TROPONIN I     Status: None   Collection Time    01/21/14  2:29 PM      Result Value Ref Range   Troponin I <0.30  <0.30 ng/mL   Comment:            Due to the release kinetics of cTnI,     a negative result within the first hours     of the onset of symptoms does not rule out     myocardial infarction with certainty.     If myocardial infarction is still suspected,     repeat the test at appropriate intervals.  PREGNANCY, URINE     Status: None   Collection Time    01/21/14  3:13 PM      Result Value Ref Range   Preg Test, Ur NEGATIVE  NEGATIVE   Comment:            THE SENSITIVITY OF THIS     METHODOLOGY IS >20 mIU/mL.    Radiological Exams on Admission: Dg Chest 2 View  01/21/2014   CLINICAL DATA:  Chest pain  EXAM: CHEST  2 VIEW  COMPARISON:  None.  FINDINGS: The heart size and mediastinal contours are within normal limits. Both lungs are clear. The visualized skeletal structures are unremarkable.  IMPRESSION: No active cardiopulmonary disease.   Electronically Signed   By: Daryll Brod M.D.   On: 01/21/2014 16:07    Assessment/Plan Principal Problem:   TIA (transient ischemic attack) Active Problems:   ESSENTIAL HYPERTENSION   Near syncope   TIA Patient had an episode, where she was unable to speak, which lasted for 10 minutes. Will admit the patient For possible TIA, CT head, MRI, carotid Doppler, 2-D echo, lipid profile in a.m.  Chest pain Will admit the patient in telemetry, obtain 2-D echocardiogram, cycle   cardiac enzymes.  Near-syncope As patient had an episode of chest pain, diaphoresis, near-syncope will also obtain CT angiogram to rule out PE.  Uncontrolled hypertension Patient has uncontrolled hypertension, and has not been taking medications. He We'll start her on hydralazine 25 mg every 6 hours when necessary for BP greater than 160/100  DVT prophylaxis Lovenox  Code status: full code  Family discussion: discussed  with family at bedside   Time Spent on Admission:  60 minutes  [facility] Pager: [phone] 01/21/2014, 4:50 PM  If 7PM-7AM, please contact night-coverage  [email or web address]  Password [email or web address]

## 2014-01-21 NOTE — ED Notes (Signed)
Dr. Ethelda ChickJacubowitz back at the bedside.

## 2014-01-21 NOTE — ED Provider Notes (Signed)
CSN: [id number]     Arrival date & time 01/21/14  1400 History   First MD Initiated Contact with Patient 01/21/14 1500     Chief Complaint  Patient presents with  . Near Syncope     (Consider location/radiation/quality/duration/timing/severity/associated sxs/prior Treatment) Patient is a 43 y.o. female presenting with near-syncope.  Near Syncope Associated symptoms include chest pain and headaches.   Patient developed anterior chest pain nonradiating onset 12:30 PM today while sitting at her desk at work. She got to walk and suffered a subsequent near syncopal event. Accompanied by sweatiness  And mild nausea. No shortness of breathShe is presently asymptomatic except for diffuse headache which occurred since she's arrived here. Brought by him. No treatment prior to coming here. Pain is improved with rubbing her chest not made worse by anything.. She is presently pain-free. Cardiac risk factors include hypercholesterolemia and hypertension otherwise may Past Medical History  Diagnosis Date  . Hypertension   . Hypercholesteremia    surgical history rotator cuff surgery History reviewed. No pertinent past surgical history. No family history on file. History  Substance Use Topics  . Smoking status: Never Smoker   . Smokeless tobacco: Not on file  . Alcohol Use: Yes   no illicit drug use surgical OB History   Grav Para Term Preterm Abortions TAB SAB Ect Mult Living                 Review of Systems  Constitutional: Negative.   Respiratory: Negative.   Cardiovascular: Positive for chest pain and near-syncope.  Gastrointestinal: Negative.   Genitourinary:       Last normal menstrual period 12/19/2013  Musculoskeletal: Negative.   Skin: Negative.   Neurological: Positive for headaches.  Psychiatric/Behavioral: Negative.   All other systems reviewed and are negative.      Allergies  Coconut flavor and Cortisone acetate  Home Medications   Current Outpatient Rx  Name   Route  Sig  Dispense  Refill  . ibuprofen (ADVIL,MOTRIN) 200 MG tablet   Oral   Take 400 mg by mouth daily as needed for headache.           BP 149/95  Pulse 88  Temp(Src) 97.8 F (36.6 C) (Oral)  Resp 23  Ht 5\' 5"  (1.651 m)  Wt 170 lb (77.111 kg)  BMI 28.29 kg/m2  SpO2 100%  LMP 12/19/2013 Physical Exam  Nursing note and vitals reviewed. Constitutional: She appears well-developed and well-nourished.  HENT:  Head: Normocephalic and atraumatic.  Eyes: Conjunctivae are normal. Pupils are equal, round, and reactive to light.  Neck: Neck supple. No tracheal deviation present. No thyromegaly present.  Cardiovascular: Normal rate and regular rhythm.   No murmur heard. Pulmonary/Chest: Effort normal and breath sounds normal.  Abdominal: Soft. Bowel sounds are normal. She exhibits no distension. There is no tenderness.  Musculoskeletal: Normal range of motion. She exhibits no edema and no tenderness.  Neurological: She is alert. Coordination normal.  Skin: Skin is warm and dry. No rash noted.  Psychiatric: She has a normal mood and affect.    ED Course  Procedures (including critical care time) Labs Review Labs Reviewed  CBC  BASIC METABOLIC PANEL  TROPONIN I  PREGNANCY, URINE   Imaging Review No results found.   EKG Interpretation   Date/Time:  Friday January 21 2014 14:05:33 EST Ventricular Rate:  78 PR Interval:  148 QRS Duration: 65 QT Interval:  374 QTC Calculation: 426 R Axis:   61 Text Interpretation:  Age not entered, assumed to be  43 years old for  purpose of ECG interpretation Sinus rhythm ST elev, probable normal early  repol pattern No old tracing to compare Confirmed by [facility]  MD, Brittanya Winburn  [phone]([id number]) on 01/21/2014 3:01:02 PM     4:30 PM headache improved after treatment Tylenol. Chest xray viewed by me Results for orders placed during the hospital encounter of 01/21/14  CBC      Result Value Ref Range   WBC 8.4  4.0 - 10.5 K/uL   RBC 4.65  3.87 -  5.11 MIL/uL   Hemoglobin 14.2  12.0 - 15.0 g/dL   HCT 60.439.7  54.036.0 - 98.146.0 %   MCV 85.4  78.0 - 100.0 fL   MCH 30.5  26.0 - 34.0 pg   MCHC 35.8  30.0 - 36.0 g/dL   RDW 19.114.1  47.811.5 - 29.515.5 %   Platelets 228  150 - 400 K/uL  BASIC METABOLIC PANEL      Result Value Ref Range   Sodium 141  137 - 147 mEq/L   Potassium 3.8  3.7 - 5.3 mEq/L   Chloride 104  96 - 112 mEq/L   CO2 20  19 - 32 mEq/L   Glucose, Bld 87  70 - 99 mg/dL   BUN 9  6 - 23 mg/dL   Creatinine, Ser 6.210.66  0.50 - 1.10 mg/dL   Calcium 9.6  8.4 - 30.810.5 mg/dL   GFR calc non Af Amer >90  >90 mL/min   GFR calc Af Amer >90  >90 mL/min  TROPONIN I      Result Value Ref Range   Troponin I <0.30  <0.30 ng/mL  PREGNANCY, URINE      Result Value Ref Range   Preg Test, Ur NEGATIVE  NEGATIVE   Dg Chest 2 View  01/21/2014   CLINICAL DATA:  Chest pain  EXAM: CHEST  2 VIEW  COMPARISON:  None.  FINDINGS: The heart size and mediastinal contours are within normal limits. Both lungs are clear. The visualized skeletal structures are unremarkable.  IMPRESSION: No active cardiopulmonary disease.   Electronically Signed   By: Ruel Favorsrevor  Shick M.D.   On: 01/21/2014 16:07    MDM  Strong Doppler embolism. No shortness of breath. Chest pain is resolved I doubt subarachnoid hemorrhage. Headache onset gradually after arrival here. Spoke with Dr. Sharl MaLama plan 23 hour observation telemetry  Final diagnoses:  None   Diagnoses #1 chest pain #2 near syncope #3 nonspecificheadache     Doug SouSam Disney Ruggiero, MD 01/21/14 [phone]

## 2014-01-22 ENCOUNTER — Observation Stay (HOSPITAL_COMMUNITY): Payer: BC Managed Care – PPO

## 2014-01-22 DIAGNOSIS — R079 Chest pain, unspecified: Secondary | ICD-10-CM

## 2014-01-22 DIAGNOSIS — I10 Essential (primary) hypertension: Secondary | ICD-10-CM

## 2014-01-22 DIAGNOSIS — R1013 Epigastric pain: Secondary | ICD-10-CM

## 2014-01-22 DIAGNOSIS — G459 Transient cerebral ischemic attack, unspecified: Secondary | ICD-10-CM

## 2014-01-22 DIAGNOSIS — K3189 Other diseases of stomach and duodenum: Secondary | ICD-10-CM

## 2014-01-22 LAB — COMPREHENSIVE METABOLIC PANEL
ALT: 5 U/L (ref 0–35)
AST: 11 U/L (ref 0–37)
Albumin: 3.6 g/dL (ref 3.5–5.2)
Alkaline Phosphatase: 69 U/L (ref 39–117)
BILIRUBIN TOTAL: 0.5 mg/dL (ref 0.3–1.2)
BUN: 7 mg/dL (ref 6–23)
CHLORIDE: 102 meq/L (ref 96–112)
CO2: 22 meq/L (ref 19–32)
Calcium: 9.2 mg/dL (ref 8.4–10.5)
Creatinine, Ser: 0.65 mg/dL (ref 0.50–1.10)
GLUCOSE: 93 mg/dL (ref 70–99)
POTASSIUM: 4 meq/L (ref 3.7–5.3)
Sodium: 137 mEq/L (ref 137–147)
TOTAL PROTEIN: 7.1 g/dL (ref 6.0–8.3)

## 2014-01-22 LAB — CBC
HCT: 37.2 % (ref 36.0–46.0)
Hemoglobin: 13.1 g/dL (ref 12.0–15.0)
MCH: 30.3 pg (ref 26.0–34.0)
MCHC: 35.2 g/dL (ref 30.0–36.0)
MCV: 85.9 fL (ref 78.0–100.0)
PLATELETS: 244 10*3/uL (ref 150–400)
RBC: 4.33 MIL/uL (ref 3.87–5.11)
RDW: 14.2 % (ref 11.5–15.5)
WBC: 7 10*3/uL (ref 4.0–10.5)

## 2014-01-22 LAB — TROPONIN I
Troponin I: <0.3 ng/mL (ref <0.30)
Troponin I: <0.3 ng/mL (ref <0.30)

## 2014-01-22 LAB — HEMOGLOBIN A1C
HEMOGLOBIN A1C: 5.5 % (ref <5.7)
Hgb A1c MFr Bld: 5.7 % — ABNORMAL HIGH (ref <5.7)
MEAN PLASMA GLUCOSE: 117 mg/dL — AB (ref <117)
Mean Plasma Glucose: 111 mg/dL (ref <117)

## 2014-01-22 LAB — LIPID PANEL
Cholesterol: 191 mg/dL (ref 0–200)
HDL: 53 mg/dL (ref >39)
LDL CALC: 124 mg/dL — AB (ref 0–99)
TRIGLYCERIDES: 68 mg/dL (ref <150)
Total CHOL/HDL Ratio: 3.6 RATIO
VLDL: 14 mg/dL (ref 0–40)

## 2014-01-22 MED ORDER — HYDROCHLOROTHIAZIDE 25 MG PO TABS
25.0000 mg | ORAL_TABLET | Freq: Every day | ORAL | Status: DC
  Administered 2014-01-22 – 2014-01-24 (×3): 25 mg via ORAL
  Filled 2014-01-22 (×3): qty 1

## 2014-01-22 MED ORDER — ENSURE COMPLETE PO LIQD
237.0000 mL | ORAL | Status: DC
  Administered 2014-01-22 – 2014-01-23 (×2): 237 mL via ORAL

## 2014-01-22 MED ORDER — SIMVASTATIN 20 MG PO TABS
20.0000 mg | ORAL_TABLET | Freq: Every day | ORAL | Status: DC
  Administered 2014-01-22 – 2014-01-23 (×2): 20 mg via ORAL
  Filled 2014-01-22 (×3): qty 1

## 2014-01-22 NOTE — Progress Notes (Signed)
Patient ID: Sherry Nicholson  female  WUJ:[id number]    DOB: 01/10/1971    DOA: 01/21/2014  PCP: Leanor RubensteinSUN,VYVYAN Y, MD  Assessment/Plan: Principal Problem:   TIA (transient ischemic attack): Risk factors of hypertension, hyperlipidemia, patient had stopped taking her medications due to family issues for the last few months - Symptoms have resolved, MRI of the brain/MRA  still pending - 2-D echo, carotid Dopplers  - Continue aspirin, restarted HCTZ  - Lipid panel showed cholesterol 191, LDL 124, started on statin   Active Problems: Chest pain: Midsternal, currently completely resolved, EKG showed nonspecific ST-T wave changes, BP elevated at 168/100 the time of admission - Discussed with cardiology, Dr Royann Shiversroitoru, recommended possible treadmill Myoview in a.m. Will follow today     ESSENTIAL HYPERTENSION -  continue hydralazine as needed started on HCTZ 25 mg daily which she used to be on previously and had stopped taking for the last few months     Near syncope - Likely due to #1, continue workup    DVT Prophylaxis:  Code Status:Full code  Family Communication:   Disposition: likely tomorrow   Consultants:   cardiology, Dr. Royann Shiversroitoru   Procedures:   pending   Antibiotics:  none    Subjective: chest pain resolved no headache or any focal weakness   Objective: Weight change:   Intake/Output Summary (Last 24 hours) at 01/22/14 1131 Last data filed at 01/22/14 0800  Gross per 24 hour  Intake      0 ml  Output      0 ml  Net      0 ml   Blood pressure 127/96, pulse 60, temperature 97.5 F (36.4 C), temperature source Oral, resp. rate 18, height 5\' 5"  (1.651 m), weight 77.111 kg (170 lb), last menstrual period 12/19/2013, SpO2 100.00%.  Physical Exam: General: Alert and awake, oriented x3, not in any acute distress. CVS: S1-S2 clear, no murmur rubs or gallops Chest: clear to auscultation bilaterally, no wheezing, rales or rhonchi Abdomen: soft nontender, nondistended,  normal bowel sounds  Extremities: no cyanosis, clubbing or edema noted bilaterally Neuro: Cranial nerves II-XII intact, no focal neurological deficits  Lab Results: Basic Metabolic Panel:  Recent Labs Lab 01/21/14 1429 01/22/14 0444  NA 141 137  K 3.8 4.0  CL 104 102  CO2 20 22  GLUCOSE 87 93  BUN 9 7  CREATININE 0.66 0.65  CALCIUM 9.6 9.2   Liver Function Tests:  Recent Labs Lab 01/22/14 0444  AST 11  ALT 5  ALKPHOS 69  BILITOT 0.5  PROT 7.1  ALBUMIN 3.6   No results found for this basename: LIPASE, AMYLASE,  in the last 168 hours No results found for this basename: AMMONIA,  in the last 168 hours CBC:  Recent Labs Lab 01/21/14 1429 01/22/14 0444  WBC 8.4 7.0  HGB 14.2 13.1  HCT 39.7 37.2  MCV 85.4 85.9  PLT 228 244   Cardiac Enzymes:  Recent Labs Lab 01/21/14 2053 01/22/14 0442 01/22/14 0827  TROPONINI <0.30 <0.30 <0.30   BNP: No components found with this basename: POCBNP,  CBG: No results found for this basename: GLUCAP,  in the last 168 hours   Micro Results: No results found for this or any previous visit (from the past 240 hour(s)).  Studies/Results: Dg Chest 2 View  01/21/2014   CLINICAL DATA:  Chest pain  EXAM: CHEST  2 VIEW  COMPARISON:  None.  FINDINGS: The heart size and mediastinal contours are within normal limits.  Both lungs are clear. The visualized skeletal structures are unremarkable.  IMPRESSION: No active cardiopulmonary disease.   Electronically Signed   By: Ruel Favors M.D.   On: 01/21/2014 16:07   Ct Head Without Contrast  01/21/2014   CLINICAL DATA:  Hot flashes and sweating.  EXAM: CT HEAD WITHOUT CONTRAST  TECHNIQUE: Contiguous axial images were obtained from the base of the skull through the vertex without contrast.  COMPARISON:  None  FINDINGS: No evidence for acute hemorrhage, mass lesion, midline shift, hydrocephalus or large infarct. Visualized sinuses are clear. No acute bone abnormality.  IMPRESSION: Negative head  CT.   Electronically Signed   By: Richarda Overlie M.D.   On: 01/21/2014 18:34   Ct Angio Chest Pe W/cm &/or Wo Cm  01/21/2014   CLINICAL DATA:  Mid chest pain.  Syncope.  Difficulty breathing.  EXAM: CT ANGIOGRAPHY CHEST WITH CONTRAST  TECHNIQUE: Multidetector CT imaging of the chest was performed using the standard protocol during bolus administration of intravenous contrast. Multiplanar CT image reconstructions and MIPs were obtained to evaluate the vascular anatomy.  CONTRAST:  OMNIPAQUE IOHEXOL 350 MG/ML SOLN  COMPARISON:  None.  FINDINGS: No filling defects in the pulmonary arteries to suggest pulmonary emboli. Heart is normal size. Aorta is normal caliber. No mediastinal, hilar, or axillary adenopathy. Chest wall soft tissues are unremarkable. Lungs are clear. No focal airspace opacities or suspicious nodules. No effusions. Imaging into the upper abdomen shows no acute findings.  No acute bony abnormality. Degenerative spurring in the mid and lower thoracic spine.  Review of the MIP images confirms the above findings.  IMPRESSION: No evidence of pulmonary embolus.  No acute findings.   Electronically Signed   By: Charlett Nose M.D.   On: 01/21/2014 18:40    Medications: Scheduled Meds: . aspirin  325 mg Oral Daily  . enoxaparin (LOVENOX) injection  40 mg Subcutaneous Q24H  . hydrochlorothiazide  25 mg Oral Daily  . simvastatin  20 mg Oral q1800      LOS: 1 day   Nakiyah Beverley M.D. [facility] 01/22/2014, 11:31 AM Pager: [phone]  If 7PM-7AM, please contact night-coverage [email or web address] Password [email or web address]

## 2014-01-22 NOTE — Progress Notes (Signed)
VASCULAR LAB PRELIMINARY  PRELIMINARY  PRELIMINARY  PRELIMINARY  Carotid Dopplers completed.    Preliminary report:  1-39% ICA stenosis.  Vertebral artery flow is antegrade.  Nakeisha Greenhouse, RVT 01/22/2014, 4:46 PM

## 2014-01-22 NOTE — Progress Notes (Signed)
INITIAL NUTRITION ASSESSMENT  DOCUMENTATION CODES Per approved criteria  -Not Applicable   INTERVENTION: Recommend Ensure Complete Q24H, each supplement provides 350 kcal and 13 grams of protein Recommend 8PM nourishment  NUTRITION DIAGNOSIS: Inadequate oral intake related to decreased appetite as evidenced by PO intake <75%  Goal: Pt to meet >/= 90% of their estimated nutrition needs    Monitor:  Total protein/energy intake, labs, weights  Reason for Assessment: MST  43 y.o. female  Admitting Dx: TIA (transient ischemic attack)  ASSESSMENT: 43 year old female who has a past medical history of Hypertension and Hypercholesteremia. she has history of poorly controlled hypertension, not been on any medications. The patient came to the ED after she had an episode of chest pain at her job, as per patient she started having midsternal chest pain and then she started walking to ease off the pain, then suddenly patient fell on the ground, though she denies passing out. Patient could not speak for 10 minutes, but was able to communicate with her hands. As per patient's friend, who witnessed this episode, patient also was diaphoretic, though she did not have any weakness of extremities or seizure like activity.  -Pt endorsed 100 lbs weight loss over 2 year period d/t lack of interest in eating/food; however, previous medical records indicate weight loss may be closer to 50 lbs over 4 year period -Diet recall indicates pt attempts to eat 3 meals/daily; however feels full after eating 25% -Will snack occasionally on yogurt, usually feels hungriest at nighttime. Will add PM snack -Pt w/out any visible muscle wasting or subcutaneous fat loss -Was willing to try Ensure product once daily to assist in meeting est nutrition needs if unable to eat full meals -Denied nausea/abd pain -Denied constipation -Pt consumed approximately 50% of lunch tray, viewed by RD during time of  assessment   Height: Ht Readings from Last 1 Encounters:  01/21/14 5\' 5"  (1.651 m)    Weight: Wt Readings from Last 1 Encounters:  01/21/14 170 lb (77.111 kg)    Ideal Body Weight: 125 lbs  % Ideal Body Weight: 136%  Wt Readings from Last 10 Encounters:  01/21/14 170 lb (77.111 kg)  01/16/10 221 lb (100.245 kg)  12/21/09 219 lb (99.338 kg)  10/31/09 216 lb (97.977 kg)  04/14/09 224 lb (101.606 kg)  12/12/08 227 lb (102.967 kg)  11/01/08 230 lb (104.327 kg)    Usual Body Weight: pt unsure  % Usual Body Weight: n/a  BMI:  Body mass index is 28.29 kg/(m^2). Overweight  Estimated Nutritional Needs: Kcal: 1600-1800 Protein: 60-70 gram Fluid: >/=1600 ml/daily  Skin: WDL  Diet Order: Cardiac  EDUCATION NEEDS: -No education needs identified at this time   Intake/Output Summary (Last 24 hours) at 01/22/14 1451 Last data filed at 01/22/14 0800  Gross per 24 hour  Intake      0 ml  Output      0 ml  Net      0 ml    Last BM: none during admit   Labs:   Recent Labs Lab 01/21/14 1429 01/22/14 0444  NA 141 137  K 3.8 4.0  CL 104 102  CO2 20 22  BUN 9 7  CREATININE 0.66 0.65  CALCIUM 9.6 9.2  GLUCOSE 87 93    CBG (last 3)  No results found for this basename: GLUCAP,  in the last 72 hours  Scheduled Meds: . aspirin  325 mg Oral Daily  . enoxaparin (LOVENOX) injection  40 mg Subcutaneous  Q24H  . feeding supplement (ENSURE COMPLETE)  237 mL Oral Q24H  . hydrochlorothiazide  25 mg Oral Daily  . simvastatin  20 mg Oral q1800    Continuous Infusions:   Past Medical History  Diagnosis Date  . Hypertension   . Hypercholesteremia   . TIA (transient ischemic attack) 01/21/2014  . Daily headache     Past Surgical History  Procedure Laterality Date  . Shoulder arthroscopy w/ rotator cuff repair Right ~ 2000    Lloyd Huger MS RD LDN Clinical Dietitian Pager:[phone]

## 2014-01-22 NOTE — Progress Notes (Signed)
Utilization Review completed.  

## 2014-01-22 NOTE — Consult Note (Signed)
Reason for Consult:  Chest pain Referring Physician: Jariah Tarkowski is an 43 y.o. female.  HPI:   The patient is a 43 yo female with a history of HTN, HLD, TIA, HA.  She presented after having chest pain at work. This occurred at rest and was sharp, precordial. Occasionally had a squeezing quality that would come and go. She noticed that it improved if she massaged the spot.  Independent of the pain, but on the same day she had a couple of episodes of weakness and diaphoresis, on one occasion she fell to the floor, but insists she did not lose consciousness. She was trying to speak, but no words came out. She was not disoriented.  CT angio and MRI/MRA of the brain are normal.  She is very active and has no problems running on the treadmill and working full time. She is premenopausal. She has occasional palpitations, but none recently. She has never experienced syncope.  Her elderly grandfather, whom she loved dearly and for who she was the primary caregiver, recently passed away. She is still very emotional about it.  Past Medical History  Diagnosis Date  . Hypertension   . Hypercholesteremia   . TIA (transient ischemic attack) 01/21/2014  . Daily headache     Past Surgical History  Procedure Laterality Date  . Shoulder arthroscopy w/ rotator cuff repair Right ~ 2000    History reviewed. No pertinent family history.  Social History:  reports that she has never smoked. She has never used smokeless tobacco. She reports that she drinks alcohol. She reports that she does not use illicit drugs.  Allergies:  Allergies  Allergen Reactions  . Coconut Flavor Itching  . Cortisone Acetate Itching and Rash    Medications: Prior to Admission medications   Medication Sig Start Date End Date Taking? Authorizing Provider  ibuprofen (ADVIL,MOTRIN) 200 MG tablet Take 400 mg by mouth daily as needed for headache.    Yes Historical Provider, MD     Results for orders placed  during the hospital encounter of 01/21/14 (from the past 48 hour(s))  CBC     Status: None   Collection Time    01/21/14  2:29 PM      Result Value Ref Range   WBC 8.4  4.0 - 10.5 K/uL   RBC 4.65  3.87 - 5.11 MIL/uL   Hemoglobin 14.2  12.0 - 15.0 g/dL   HCT 39.7  36.0 - 46.0 %   MCV 85.4  78.0 - 100.0 fL   MCH 30.5  26.0 - 34.0 pg   MCHC 35.8  30.0 - 36.0 g/dL   RDW 14.1  11.5 - 15.5 %   Platelets 228  150 - 400 K/uL  BASIC METABOLIC PANEL     Status: None   Collection Time    01/21/14  2:29 PM      Result Value Ref Range   Sodium 141  137 - 147 mEq/L   Potassium 3.8  3.7 - 5.3 mEq/L   Chloride 104  96 - 112 mEq/L   CO2 20  19 - 32 mEq/L   Glucose, Bld 87  70 - 99 mg/dL   BUN 9  6 - 23 mg/dL   Creatinine, Ser 0.66  0.50 - 1.10 mg/dL   Calcium 9.6  8.4 - 10.5 mg/dL   GFR calc non Af Amer >90  >90 mL/min   GFR calc Af Amer >90  >90 mL/min   Comment: (NOTE)  The eGFR has been calculated using the CKD EPI equation.     This calculation has not been validated in all clinical situations.     eGFR's persistently <90 mL/min signify possible Chronic Kidney     Disease.  TROPONIN I     Status: None   Collection Time    01/21/14  2:29 PM      Result Value Ref Range   Troponin I <0.30  <0.30 ng/mL   Comment:            Due to the release kinetics of cTnI,     a negative result within the first hours     of the onset of symptoms does not rule out     myocardial infarction with certainty.     If myocardial infarction is still suspected,     repeat the test at appropriate intervals.  PREGNANCY, URINE     Status: None   Collection Time    01/21/14  3:13 PM      Result Value Ref Range   Preg Test, Ur NEGATIVE  NEGATIVE   Comment:            THE SENSITIVITY OF THIS     METHODOLOGY IS >20 mIU/mL.  URINE RAPID DRUG SCREEN (HOSP PERFORMED)     Status: Abnormal   Collection Time    01/21/14  3:13 PM      Result Value Ref Range   Opiates NONE DETECTED  NONE DETECTED   Cocaine  NONE DETECTED  NONE DETECTED   Benzodiazepines NONE DETECTED  NONE DETECTED   Amphetamines NONE DETECTED  NONE DETECTED   Tetrahydrocannabinol POSITIVE (*) NONE DETECTED   Barbiturates NONE DETECTED  NONE DETECTED   Comment:            DRUG SCREEN FOR MEDICAL PURPOSES     ONLY.  IF CONFIRMATION IS NEEDED     FOR ANY PURPOSE, NOTIFY LAB     WITHIN 5 DAYS.                LOWEST DETECTABLE LIMITS     FOR URINE DRUG SCREEN     Drug Class       Cutoff (ng/mL)     Amphetamine      1000     Barbiturate      200     Benzodiazepine   665     Tricyclics       993     Opiates          300     Cocaine          300     THC              50  HEMOGLOBIN A1C     Status: None   Collection Time    01/21/14  8:53 PM      Result Value Ref Range   Hemoglobin A1C 5.5  <5.7 %   Comment: (NOTE)                                                                               According to the ADA Clinical Practice Recommendations for 2011, when  HbA1c is used as a screening test:      >=6.5%   Diagnostic of Diabetes Mellitus               (if abnormal result is confirmed)     5.7-6.4%   Increased risk of developing Diabetes Mellitus     References:Diagnosis and Classification of Diabetes Mellitus,Diabetes     VOZD,6644,03(KVQQV 1):S62-S69 and Standards of Medical Care in             Diabetes - 2011,Diabetes ZDGL,8756,43 (Suppl 1):S11-S61.   Mean Plasma Glucose 111  <117 mg/dL   Comment: Performed at [facility]  TROPONIN I     Status: None   Collection Time    01/21/14  8:53 PM      Result Value Ref Range   Troponin I <0.30  <0.30 ng/mL   Comment:            Due to the release kinetics of cTnI,     a negative result within the first hours     of the onset of symptoms does not rule out     myocardial infarction with certainty.     If myocardial infarction is still suspected,     repeat the test at appropriate intervals.  LIPID PANEL     Status: Abnormal   Collection Time    01/22/14   4:42 AM      Result Value Ref Range   Cholesterol 191  0 - 200 mg/dL   Triglycerides 68  <150 mg/dL   HDL 53  >39 mg/dL   Total CHOL/HDL Ratio 3.6     VLDL 14  0 - 40 mg/dL   LDL Cholesterol 124 (*) 0 - 99 mg/dL   Comment:            Total Cholesterol/HDL:CHD Risk     Coronary Heart Disease Risk Table                         Men   Women      1/2 Average Risk   3.4   3.3      Average Risk       5.0   4.4      2 X Average Risk   9.6   7.1      3 X Average Risk  23.4   11.0                Use the calculated Patient Ratio     above and the CHD Risk Table     to determine the patient's CHD Risk.                ATP III CLASSIFICATION (LDL):      <100     mg/dL   Optimal      100-129  mg/dL   Near or Above                        Optimal      130-159  mg/dL   Borderline      160-189  mg/dL   High      >190     mg/dL   Very High  TROPONIN I     Status: None   Collection Time    01/22/14  4:42 AM      Result Value Ref Range   Troponin I <0.30  <0.30 ng/mL   Comment:  Due to the release kinetics of cTnI,     a negative result within the first hours     of the onset of symptoms does not rule out     myocardial infarction with certainty.     If myocardial infarction is still suspected,     repeat the test at appropriate intervals.  COMPREHENSIVE METABOLIC PANEL     Status: None   Collection Time    01/22/14  4:44 AM      Result Value Ref Range   Sodium 137  137 - 147 mEq/L   Potassium 4.0  3.7 - 5.3 mEq/L   Chloride 102  96 - 112 mEq/L   CO2 22  19 - 32 mEq/L   Glucose, Bld 93  70 - 99 mg/dL   BUN 7  6 - 23 mg/dL   Creatinine, Ser 0.65  0.50 - 1.10 mg/dL   Calcium 9.2  8.4 - 10.5 mg/dL   Total Protein 7.1  6.0 - 8.3 g/dL   Albumin 3.6  3.5 - 5.2 g/dL   AST 11  0 - 37 U/L   ALT 5  0 - 35 U/L   Alkaline Phosphatase 69  39 - 117 U/L   Total Bilirubin 0.5  0.3 - 1.2 mg/dL   GFR calc non Af Amer >90  >90 mL/min   GFR calc Af Amer >90  >90 mL/min   Comment: (NOTE)       The eGFR has been calculated using the CKD EPI equation.     This calculation has not been validated in all clinical situations.     eGFR's persistently <90 mL/min signify possible Chronic Kidney     Disease.  CBC     Status: None   Collection Time    01/22/14  4:44 AM      Result Value Ref Range   WBC 7.0  4.0 - 10.5 K/uL   RBC 4.33  3.87 - 5.11 MIL/uL   Hemoglobin 13.1  12.0 - 15.0 g/dL   HCT 37.2  36.0 - 46.0 %   MCV 85.9  78.0 - 100.0 fL   MCH 30.3  26.0 - 34.0 pg   MCHC 35.2  30.0 - 36.0 g/dL   RDW 14.2  11.5 - 15.5 %   Platelets 244  150 - 400 K/uL  TROPONIN I     Status: None   Collection Time    01/22/14  8:27 AM      Result Value Ref Range   Troponin I <0.30  <0.30 ng/mL   Comment:            Due to the release kinetics of cTnI,     a negative result within the first hours     of the onset of symptoms does not rule out     myocardial infarction with certainty.     If myocardial infarction is still suspected,     repeat the test at appropriate intervals.    Dg Chest 2 View  01/21/2014   CLINICAL DATA:  Chest pain  EXAM: CHEST  2 VIEW  COMPARISON:  None.  FINDINGS: The heart size and mediastinal contours are within normal limits. Both lungs are clear. The visualized skeletal structures are unremarkable.  IMPRESSION: No active cardiopulmonary disease.   Electronically Signed   By: Daryll Brod M.D.   On: 01/21/2014 16:07   Ct Head Without Contrast  01/21/2014   CLINICAL DATA:  Hot flashes and sweating.  EXAM:  CT HEAD WITHOUT CONTRAST  TECHNIQUE: Contiguous axial images were obtained from the base of the skull through the vertex without contrast.  COMPARISON:  None  FINDINGS: No evidence for acute hemorrhage, mass lesion, midline shift, hydrocephalus or large infarct. Visualized sinuses are clear. No acute bone abnormality.  IMPRESSION: Negative head CT.   Electronically Signed   By: Markus Daft M.D.   On: 01/21/2014 18:34   Ct Angio Chest Pe W/cm &/or Wo Cm  01/21/2014    CLINICAL DATA:  Mid chest pain.  Syncope.  Difficulty breathing.  EXAM: CT ANGIOGRAPHY CHEST WITH CONTRAST  TECHNIQUE: Multidetector CT imaging of the chest was performed using the standard protocol during bolus administration of intravenous contrast. Multiplanar CT image reconstructions and MIPs were obtained to evaluate the vascular anatomy.  CONTRAST:  125m OMNIPAQUE IOHEXOL 350 MG/ML SOLN  COMPARISON:  None.  FINDINGS: No filling defects in the pulmonary arteries to suggest pulmonary emboli. Heart is normal size. Aorta is normal caliber. No mediastinal, hilar, or axillary adenopathy. Chest wall soft tissues are unremarkable. Lungs are clear. No focal airspace opacities or suspicious nodules. No effusions. Imaging into the upper abdomen shows no acute findings.  No acute bony abnormality. Degenerative spurring in the mid and lower thoracic spine.  Review of the MIP images confirms the above findings.  IMPRESSION: No evidence of pulmonary embolus.  No acute findings.   Electronically Signed   By: KRolm BaptiseM.D.   On: 01/21/2014 18:40   ECG is normal  No arrhythmia on telemetry   Review of Systems  All other systems reviewed and are negative.  The patient specifically denies any chest pain with exertion, dyspnea at rest or with exertion, orthopnea, paroxysmal nocturnal dyspnea, intermittent claudication, lower extremity edema, unexplained weight gain, cough, hemoptysis or wheezing.  The patient also denies abdominal pain, nausea, vomiting, dysphagia, diarrhea, constipation, polyuria, polydipsia, dysuria, hematuria, frequency, urgency, abnormal bleeding or bruising, fever, chills, unexpected weight changes, mood swings, change in skin or hair texture, change in voice quality, auditory or visual problems, allergic reactions or rashes, new musculoskeletal complaints other than usual "aches and pains".  Blood pressure 127/96, pulse 60, temperature 97.5 F (36.4 C), temperature source Oral, resp. rate  18, height 5' 5"  (1.651 m), weight 170 lb (77.111 kg), last menstrual period 12/19/2013, SpO2 100.00%. Physical Exam  General: Alert, oriented x3, no distress Head: no evidence of trauma, PERRL, EOMI, no exophtalmos or lid lag, no myxedema, no xanthelasma; normal ears, nose and oropharynx Neck: normal jugular venous pulsations and no hepatojugular reflux; brisk carotid pulses without delay and no carotid bruits Chest: clear to auscultation, no signs of consolidation by percussion or palpation, normal fremitus, symmetrical and full respiratory excursions Cardiovascular: normal position and quality of the apical impulse, regular rhythm, normal first and second heart sounds, no murmurs, rubs or gallops Abdomen: no tenderness or distention, no masses by palpation, no abnormal pulsatility or arterial bruits, normal bowel sounds, no hepatosplenomegaly Extremities: no clubbing, cyanosis or edema; 2+ radial, ulnar and brachial pulses bilaterally; 2+ right femoral, posterior tibial and dorsalis pedis pulses; 2+ left femoral, posterior tibial and dorsalis pedis pulses; no subclavian or femoral bruits Neurological: grossly nonfocal  Assessment/Plan:  Chest pain is atypical and likely musculoskeletal. She does have some coronary risk factors and a treadmill ECG is not unreasonable, but it could be performed as an outpatient if she is ready for discharge otherwise. Her near-syncopal event is puzzling, although it does have some features suggestive of a  vasovagal event. Not sure it truly was a TIA. HTN at time of event may have been reactive, rather than causal. Agree with Dr. Josem Kaufmann treatment plan.

## 2014-01-23 DIAGNOSIS — G459 Transient cerebral ischemic attack, unspecified: Secondary | ICD-10-CM

## 2014-01-23 DIAGNOSIS — R079 Chest pain, unspecified: Secondary | ICD-10-CM

## 2014-01-23 DIAGNOSIS — R55 Syncope and collapse: Secondary | ICD-10-CM

## 2014-01-23 DIAGNOSIS — E785 Hyperlipidemia, unspecified: Secondary | ICD-10-CM | POA: Diagnosis present

## 2014-01-23 DIAGNOSIS — I10 Essential (primary) hypertension: Secondary | ICD-10-CM

## 2014-01-23 MED ORDER — SIMVASTATIN 20 MG PO TABS: 20.0000 mg | ORAL_TABLET | Freq: Every day | ORAL | Status: DC

## 2014-01-23 MED ORDER — ZOLPIDEM TARTRATE 5 MG PO TABS
5.0000 mg | ORAL_TABLET | Freq: Once | ORAL | Status: AC
  Administered 2014-01-23: 5 mg via ORAL
  Filled 2014-01-23: qty 1

## 2014-01-23 MED ORDER — ASPIRIN EC 81 MG PO TBEC: 81.0000 mg | DELAYED_RELEASE_TABLET | Freq: Every day | ORAL | Status: DC

## 2014-01-23 MED ORDER — HYDROCHLOROTHIAZIDE 25 MG PO TABS: 25.0000 mg | ORAL_TABLET | Freq: Every day | ORAL | Status: DC

## 2014-01-23 NOTE — Progress Notes (Signed)
  Echocardiogram 2D Echocardiogram has been performed.  Jimya Ciani FRANCES 01/23/2014, 2:53 PM

## 2014-01-23 NOTE — Progress Notes (Signed)
The patient tolerated the exercise stress test well.  Mild chest tightness.  Cardiologist interpretation to follow  Wilburt FinlayHAGER, Jesscia Imm PAC

## 2014-01-23 NOTE — Progress Notes (Signed)
Patient ID: Sherry Nicholson, female   DOB: 1971/09/23, 43 y.o.   MRN: [id number]   Patient Name: Sherry Nicholson Date of Encounter: 01/23/2014     Principal Problem:   TIA (transient ischemic attack) Active Problems:   ESSENTIAL HYPERTENSION   Near syncope    SUBJECTIVE Chest pain is resolved. C/o headache  CURRENT MEDS . aspirin  325 mg Oral Daily  . enoxaparin (LOVENOX) injection  40 mg Subcutaneous Q24H  . feeding supplement (ENSURE COMPLETE)  237 mL Oral Q24H  . hydrochlorothiazide  25 mg Oral Daily  . simvastatin  20 mg Oral q1800    OBJECTIVE  Filed Vitals:   01/22/14 1711 01/22/14 2000 01/23/14 0000 01/23/14 0400  BP: 138/89 115/69 135/83 111/78  Pulse: 75 68 83 73  Temp: 98.6 F (37 C) 99 F (37.2 C) 97.8 F (36.6 C) 98.8 F (37.1 C)  TempSrc: Oral Oral Oral Oral  Resp: 18 15 15 15   Height:      Weight:      SpO2: 100% 100% 100% 99%    Intake/Output Summary (Last 24 hours) at 01/23/14 0915 Last data filed at 01/22/14 1806  Gross per 24 hour  Intake    440 ml  Output      2 ml  Net    438 ml   Filed Weights   01/21/14 1409  Weight: 170 lb (77.111 kg)    PHYSICAL EXAM  General: Pleasant, NAD. Neuro: Alert and oriented X 3. Moves all extremities spontaneously.  Neck: Supple without bruits or JVD. Lungs:  Resp regular and unlabored, CTA. Heart: RRR no s3, s4, or murmurs. Abdomen: Soft, non-tender, non-distended, BS + x 4.  Extremities: No clubbing, cyanosis or edema. DP/PT/Radials 2+ and equal bilaterally.  Accessory Clinical Findings  CBC  Recent Labs  01/21/14 1429 01/22/14 0444  WBC 8.4 7.0  HGB 14.2 13.1  HCT 39.7 37.2  MCV 85.4 85.9  PLT 228 244   Basic Metabolic Panel  Recent Labs  01/21/14 1429 01/22/14 0444  NA 141 137  K 3.8 4.0  CL 104 102  CO2 20 22  GLUCOSE 87 93  BUN 9 7  CREATININE 0.66 0.65  CALCIUM 9.6 9.2   Liver Function Tests  Recent Labs  01/22/14 0444  AST 11  ALT 5  ALKPHOS 69  BILITOT 0.5   PROT 7.1  ALBUMIN 3.6   No results found for this basename: LIPASE, AMYLASE,  in the last 72 hours Cardiac Enzymes  Recent Labs  01/21/14 2053 01/22/14 0442 01/22/14 0827  TROPONINI <0.30 <0.30 <0.30   BNP No components found with this basename: POCBNP,  D-Dimer No results found for this basename: DDIMER,  in the last 72 hours Hemoglobin A1C  Recent Labs  01/22/14 0444  HGBA1C 5.7*   Fasting Lipid Panel  Recent Labs  01/22/14 0442  CHOL 191  HDL 53  LDLCALC 124*  TRIG 68  CHOLHDL 3.6   Thyroid Function Tests No results found for this basename: TSH, T4TOTAL, FREET3, T3FREE, THYROIDAB,  in the last 72 hours  TELE nsr   Radiology/Studies  Dg Chest 2 View  01/21/2014   CLINICAL DATA:  Chest pain  EXAM: CHEST  2 VIEW  COMPARISON:  None.  FINDINGS: The heart size and mediastinal contours are within normal limits. Both lungs are clear. The visualized skeletal structures are unremarkable.  IMPRESSION: No active cardiopulmonary disease.   Electronically Signed   By: Ruel Favors M.D.   On: 01/21/2014 16:07  Ct Head Without Contrast  01/21/2014   CLINICAL DATA:  Hot flashes and sweating.  EXAM: CT HEAD WITHOUT CONTRAST  TECHNIQUE: Contiguous axial images were obtained from the base of the skull through the vertex without contrast.  COMPARISON:  None  FINDINGS: No evidence for acute hemorrhage, mass lesion, midline shift, hydrocephalus or large infarct. Visualized sinuses are clear. No acute bone abnormality.  IMPRESSION: Negative head CT.   Electronically Signed   By: Richarda OverlieAdam  Henn M.D.   On: 01/21/2014 18:34   Ct Angio Chest Pe W/cm &/or Wo Cm  01/21/2014   CLINICAL DATA:  Mid chest pain.  Syncope.  Difficulty breathing.  EXAM: CT ANGIOGRAPHY CHEST WITH CONTRAST  TECHNIQUE: Multidetector CT imaging of the chest was performed using the standard protocol during bolus administration of intravenous contrast. Multiplanar CT image reconstructions and MIPs were obtained to evaluate  the vascular anatomy.  CONTRAST:  100mL OMNIPAQUE IOHEXOL 350 MG/ML SOLN  COMPARISON:  None.  FINDINGS: No filling defects in the pulmonary arteries to suggest pulmonary emboli. Heart is normal size. Aorta is normal caliber. No mediastinal, hilar, or axillary adenopathy. Chest wall soft tissues are unremarkable. Lungs are clear. No focal airspace opacities or suspicious nodules. No effusions. Imaging into the upper abdomen shows no acute findings.  No acute bony abnormality. Degenerative spurring in the mid and lower thoracic spine.  Review of the MIP images confirms the above findings.  IMPRESSION: No evidence of pulmonary embolus.  No acute findings.   Electronically Signed   By: Charlett NoseKevin  Dover M.D.   On: 01/21/2014 18:40   Mri Brain Without Contrast  01/22/2014   CLINICAL DATA:  Episode of difficulty speaking.  Evaluate for TIA.  EXAM: MRI HEAD WITHOUT CONTRAST  MRA HEAD WITHOUT CONTRAST  TECHNIQUE: Multiplanar, multiecho pulse sequences of the brain and surrounding structures were obtained without intravenous contrast. Angiographic images of the head were obtained using MRA technique without contrast.  COMPARISON:  Head CT 01/21/2014  FINDINGS: MRI HEAD FINDINGS  There is no evidence of acute infarct, intracranial hemorrhage, mass, midline shift, or extra-axial fluid collection. There are a few punctate foci of T2 hyperintensity within the cerebral white matter bilaterally, nonspecific but may reflect minimal chronic small vessel ischemic change. Ventricles and sulci are within normal limits for age.  Orbits are unremarkable. Paranasal sinuses and mastoid air cells are clear. Major intracranial vascular flow voids are preserved.  MRA HEAD FINDINGS  Visualized distal vertebral arteries are patent and codominant. PICA origins are patent bilaterally. SCA origins are patent. Basilar artery is patent without stenosis. PCAs are unremarkable. Posterior communicating arteries are not clearly identified.  Internal  carotid arteries are patent from skullbase to carotid termini. ACAs and MCAs are unremarkable. No intracranial aneurysm is identified.  IMPRESSION: 1. No evidence of acute intracranial abnormality. 2. Unremarkable head MRA.   Electronically Signed   By: Sebastian AcheAllen  Grady   On: 01/22/2014 13:54   Mr Maxine GlennMra Head/brain Wo Cm  01/22/2014   CLINICAL DATA:  Episode of difficulty speaking.  Evaluate for TIA.  EXAM: MRI HEAD WITHOUT CONTRAST  MRA HEAD WITHOUT CONTRAST  TECHNIQUE: Multiplanar, multiecho pulse sequences of the brain and surrounding structures were obtained without intravenous contrast. Angiographic images of the head were obtained using MRA technique without contrast.  COMPARISON:  Head CT 01/21/2014  FINDINGS: MRI HEAD FINDINGS  There is no evidence of acute infarct, intracranial hemorrhage, mass, midline shift, or extra-axial fluid collection. There are a few punctate foci of T2 hyperintensity  within the cerebral white matter bilaterally, nonspecific but may reflect minimal chronic small vessel ischemic change. Ventricles and sulci are within normal limits for age.  Orbits are unremarkable. Paranasal sinuses and mastoid air cells are clear. Major intracranial vascular flow voids are preserved.  MRA HEAD FINDINGS  Visualized distal vertebral arteries are patent and codominant. PICA origins are patent bilaterally. SCA origins are patent. Basilar artery is patent without stenosis. PCAs are unremarkable. Posterior communicating arteries are not clearly identified.  Internal carotid arteries are patent from skullbase to carotid termini. ACAs and MCAs are unremarkable. No intracranial aneurysm is identified.  IMPRESSION: 1. No evidence of acute intracranial abnormality. 2. Unremarkable head MRA.   Electronically Signed   By: Sebastian Ache   On: 01/22/2014 13:54    ASSESSMENT AND PLAN 1. Chest pain - she has ruled out for MI. No additional chest pain. She is low risk and ok for discharge from my perspective.  2.  Headache - I will defer to primary team.   Sharlot Gowda Stephie Xu,M.D.  01/23/2014 9:15 AM

## 2014-01-24 DIAGNOSIS — E785 Hyperlipidemia, unspecified: Secondary | ICD-10-CM

## 2014-01-24 LAB — TSH: TSH: 1.417 u[IU]/mL (ref 0.350–4.500)

## 2014-01-24 MED ORDER — ASPIRIN EC 81 MG PO TBEC: 81.0000 mg | DELAYED_RELEASE_TABLET | Freq: Every day | ORAL | Status: DC

## 2014-01-24 MED ORDER — HYDROCHLOROTHIAZIDE 25 MG PO TABS: 25.0000 mg | ORAL_TABLET | Freq: Every day | ORAL | Status: DC

## 2014-01-24 MED ORDER — SIMVASTATIN 20 MG PO TABS: 20.0000 mg | ORAL_TABLET | Freq: Every day | ORAL | Status: DC

## 2014-01-24 NOTE — Discharge Summary (Signed)
Physician Discharge Summary  Patient ID: Glenard HaringChiquitia Leja MRN: [id number] DOB/AGE: 42/03/1971 42 y.o.  Admit date: 01/21/2014 Discharge date: 01/24/2014  Primary Care Physician:  Leanor RubensteinSUN,VYVYAN Y, MD  Discharge Diagnoses:    . Near syncope . TIA (transient ischemic attack) . ESSENTIAL HYPERTENSION . Dyslipidemia  Consults: Cardiology   Recommendations for Outpatient Follow-up:  Please follow TSH  Allergies:   Allergies  Allergen Reactions  . Coconut Flavor Itching  . Cortisone Acetate Itching and Rash     Discharge Medications:   Medication List         aspirin EC 81 MG tablet  Take 1 tablet (81 mg total) by mouth daily.     hydrochlorothiazide 25 MG tablet  Commonly known as:  HYDRODIURIL  Take 1 tablet (25 mg total) by mouth daily.     ibuprofen 200 MG tablet  Commonly known as:  ADVIL,MOTRIN  Take 400 mg by mouth daily as needed for headache.     simvastatin 20 MG tablet  Commonly known as:  ZOCOR  Take 1 tablet (20 mg total) by mouth at bedtime.         Brief H and P: For complete details please refer to admission H and P, but in brief 43 year old female who has a past medical history of Hypertension and Hypercholesteremia. she has history of poorly controlled hypertension, not been on any medications. The patient came to the ED after she had an episode of chest pain at her job, as per patient she started having midsternal chest pain and then she started walking to ease off the pain, then suddenly patient fell on the ground, though she denies passing out. Patient could not speak for 10 minutes, but was able to communicate with her hands. As per patient's friend, who witnessed this episode, patient also was diaphoretic, though she did not have any weakness of extremities or seizure like activity.  When the EMT arrived, patient had elevated blood pressure of 168/100.    Hospital Course:  TIA (transient ischemic attack): Risk factors of hypertension,  hyperlipidemia, patient had stopped taking her medications due to family issues for the last few months. Symptoms have completely resolved. MRI of the brain was negative for acute CVA. MRA brain showed normal intracranial circulation . 2-D echo showed preserved EF, no regional wall motion abnormalities. Carotid Dopplers showed 1-39% bilateral ICA stenosis. Patient was placed on aspirin. HCTZ was restarted. Lipid panel showed cholesterol 191, LDL 124, started on statin. A1c 5.7    Chest pain: Midsternal, currently completely resolved, EKG showed nonspecific ST-T wave changes, BP was elevated at 168/100 the time of admission.patient underwent exercise tolerance test no ST-T wave changes congestive of any ischemia. Discussed with Dr. Elease HashimotoNahser. 2-D echo was also normal.     ESSENTIAL HYPERTENSION  currently stable, patient was restarted on HCTZ 25 mg daily which she used to be on previously and had stopped taking for the last few months.   Near syncope - Likely due to #1 resolved. Please follow TSH  Day of Discharge BP 118/73  Pulse 109  Temp(Src) 98.3 F (36.8 C) (Oral)  Resp 16  Ht 5\' 5"  (1.651 m)  Wt 77.111 kg (170 lb)  BMI 28.29 kg/m2  SpO2 100%  LMP 12/19/2013  Physical Exam: General: Alert and awake oriented x3 not in any acute distress. HEENT: anicteric sclera, pupils reactive to light and accommodation CVS: S1-S2 clear no murmur rubs or gallops Chest: clear to auscultation bilaterally, no wheezing rales or rhonchi Abdomen: soft  nontender, nondistended, normal bowel sounds Extremities: no cyanosis, clubbing or edema noted bilaterally Neuro: Cranial nerves II-XII intact, no focal neurological deficits   The results of significant diagnostics from this hospitalization (including imaging, microbiology, ancillary and laboratory) are listed below for reference.    LAB RESULTS: Basic Metabolic Panel:  Recent Labs Lab 01/21/14 1429 01/22/14 0444  NA 141 137  K 3.8 4.0  CL 104 102   CO2 20 22  GLUCOSE 87 93  BUN 9 7  CREATININE 0.66 0.65  CALCIUM 9.6 9.2   Liver Function Tests:  Recent Labs Lab 01/22/14 0444  AST 11  ALT 5  ALKPHOS 69  BILITOT 0.5  PROT 7.1  ALBUMIN 3.6   No results found for this basename: LIPASE, AMYLASE,  in the last 168 hours No results found for this basename: AMMONIA,  in the last 168 hours CBC:  Recent Labs Lab 01/21/14 1429 01/22/14 0444  WBC 8.4 7.0  HGB 14.2 13.1  HCT 39.7 37.2  MCV 85.4 85.9  PLT 228 244   Cardiac Enzymes:  Recent Labs Lab 01/22/14 0442 01/22/14 0827  TROPONINI <0.30 <0.30   BNP: No components found with this basename: POCBNP,  CBG: No results found for this basename: GLUCAP,  in the last 168 hours  Significant Diagnostic Studies:  Dg Chest 2 View  01/21/2014   CLINICAL DATA:  Chest pain  EXAM: CHEST  2 VIEW  COMPARISON:  None.  FINDINGS: The heart size and mediastinal contours are within normal limits. Both lungs are clear. The visualized skeletal structures are unremarkable.  IMPRESSION: No active cardiopulmonary disease.   Electronically Signed   By: Ruel Favors M.D.   On: 01/21/2014 16:07   Ct Head Without Contrast  01/21/2014   CLINICAL DATA:  Hot flashes and sweating.  EXAM: CT HEAD WITHOUT CONTRAST  TECHNIQUE: Contiguous axial images were obtained from the base of the skull through the vertex without contrast.  COMPARISON:  None  FINDINGS: No evidence for acute hemorrhage, mass lesion, midline shift, hydrocephalus or large infarct. Visualized sinuses are clear. No acute bone abnormality.  IMPRESSION: Negative head CT.   Electronically Signed   By: Richarda Overlie M.D.   On: 01/21/2014 18:34   Ct Angio Chest Pe W/cm &/or Wo Cm  01/21/2014   CLINICAL DATA:  Mid chest pain.  Syncope.  Difficulty breathing.  EXAM: CT ANGIOGRAPHY CHEST WITH CONTRAST  TECHNIQUE: Multidetector CT imaging of the chest was performed using the standard protocol during bolus administration of intravenous contrast.  Multiplanar CT image reconstructions and MIPs were obtained to evaluate the vascular anatomy.  CONTRAST:  OMNIPAQUE IOHEXOL 350 MG/ML SOLN  COMPARISON:  None.  FINDINGS: No filling defects in the pulmonary arteries to suggest pulmonary emboli. Heart is normal size. Aorta is normal caliber. No mediastinal, hilar, or axillary adenopathy. Chest wall soft tissues are unremarkable. Lungs are clear. No focal airspace opacities or suspicious nodules. No effusions. Imaging into the upper abdomen shows no acute findings.  No acute bony abnormality. Degenerative spurring in the mid and lower thoracic spine.  Review of the MIP images confirms the above findings.  IMPRESSION: No evidence of pulmonary embolus.  No acute findings.   Electronically Signed   By: Charlett Nose M.D.   On: 01/21/2014 18:40   Mri Brain Without Contrast  01/22/2014   CLINICAL DATA:  Episode of difficulty speaking.  Evaluate for TIA.  EXAM: MRI HEAD WITHOUT CONTRAST  MRA HEAD WITHOUT CONTRAST  TECHNIQUE: Multiplanar,  multiecho pulse sequences of the brain and surrounding structures were obtained without intravenous contrast. Angiographic images of the head were obtained using MRA technique without contrast.  COMPARISON:  Head CT 01/21/2014  FINDINGS: MRI HEAD FINDINGS  There is no evidence of acute infarct, intracranial hemorrhage, mass, midline shift, or extra-axial fluid collection. There are a few punctate foci of T2 hyperintensity within the cerebral white matter bilaterally, nonspecific but may reflect minimal chronic small vessel ischemic change. Ventricles and sulci are within normal limits for age.  Orbits are unremarkable. Paranasal sinuses and mastoid air cells are clear. Major intracranial vascular flow voids are preserved.  MRA HEAD FINDINGS  Visualized distal vertebral arteries are patent and codominant. PICA origins are patent bilaterally. SCA origins are patent. Basilar artery is patent without stenosis. PCAs are unremarkable.  Posterior communicating arteries are not clearly identified.  Internal carotid arteries are patent from skullbase to carotid termini. ACAs and MCAs are unremarkable. No intracranial aneurysm is identified.  IMPRESSION: 1. No evidence of acute intracranial abnormality. 2. Unremarkable head MRA.   Electronically Signed   By: Sebastian Ache   On: 01/22/2014 13:54   Mr Maxine Glenn Head/brain Wo Cm  01/22/2014   CLINICAL DATA:  Episode of difficulty speaking.  Evaluate for TIA.  EXAM: MRI HEAD WITHOUT CONTRAST  MRA HEAD WITHOUT CONTRAST  TECHNIQUE: Multiplanar, multiecho pulse sequences of the brain and surrounding structures were obtained without intravenous contrast. Angiographic images of the head were obtained using MRA technique without contrast.  COMPARISON:  Head CT 01/21/2014  FINDINGS: MRI HEAD FINDINGS  There is no evidence of acute infarct, intracranial hemorrhage, mass, midline shift, or extra-axial fluid collection. There are a few punctate foci of T2 hyperintensity within the cerebral white matter bilaterally, nonspecific but may reflect minimal chronic small vessel ischemic change. Ventricles and sulci are within normal limits for age.  Orbits are unremarkable. Paranasal sinuses and mastoid air cells are clear. Major intracranial vascular flow voids are preserved.  MRA HEAD FINDINGS  Visualized distal vertebral arteries are patent and codominant. PICA origins are patent bilaterally. SCA origins are patent. Basilar artery is patent without stenosis. PCAs are unremarkable. Posterior communicating arteries are not clearly identified.  Internal carotid arteries are patent from skullbase to carotid termini. ACAs and MCAs are unremarkable. No intracranial aneurysm is identified.  IMPRESSION: 1. No evidence of acute intracranial abnormality. 2. Unremarkable head MRA.   Electronically Signed   By: Sebastian Ache   On: 01/22/2014 13:54    2D ECHO: Study Conclusions  Left ventricle: The cavity size was normal.  Systolic function was normal. Wall motion was normal; there were no regional wall motion abnormalities.    Disposition and Follow-up: Discharge Orders   Future Orders Complete By Expires   Diet - low sodium heart healthy  As directed    Increase activity slowly  As directed        DISPOSITION: Home   DIET: Heart healthy     DISCHARGE FOLLOW-UP Follow-up Information   Follow up with Leanor Rubenstein, MD. Schedule an appointment as soon as possible for a visit in 2 weeks. (for hospital follow-up)    Specialty:  Family Medicine   Contact information:   [address] [place] [address] [phone]       Time spent on Discharge: 35 minutes   Signed:   Tayvian Holycross M.D. [facility] 01/24/2014, 12:10 PM Pager: [phone]

## 2014-01-24 NOTE — Discharge Instructions (Signed)
STROKE/TIA DISCHARGE INSTRUCTIONS SMOKING Cigarette smoking nearly doubles your risk of having a stroke & is the single most alterable risk factor  If you smoke or have smoked in the last 12 months, you are advised to quit smoking for your health.  Most of the excess cardiovascular risk related to smoking disappears within a year of stopping.  Ask you doctor about anti-smoking medications  [facility] Quit Line: 1-800-QUIT NOW  Free Smoking Cessation Classes [phone]  CHOLESTEROL Know your levels; limit fat & cholesterol in your diet  Lipid Panel     Component Value Date/Time   CHOL 191 01/22/2014 0442   TRIG 68 01/22/2014 0442   HDL 53 01/22/2014 0442   CHOLHDL 3.6 01/22/2014 0442   VLDL 14 01/22/2014 0442   LDLCALC 124* 01/22/2014 0442      Many patients benefit from treatment even if their cholesterol is at goal.  Goal: Total Cholesterol (CHOL) less than 160  Goal:  Triglycerides (TRIG) less than 150  Goal:  HDL greater than 40  Goal:  LDL (LDLCALC) less than 100   BLOOD PRESSURE American Stroke Association blood pressure target is less that 120/80 mm/Hg  Your discharge blood pressure is:  BP: 118/73 mmHg  Monitor your blood pressure  Limit your salt and alcohol intake  Many individuals will require more than one medication for high blood pressure  DIABETES (A1c is a blood sugar average for last 3 months) Goal HGBA1c is under 7% (HBGA1c is blood sugar average for last 3 months)  Diabetes: No known diagnosis of diabetes    Lab Results  Component Value Date   HGBA1C 5.7* 01/22/2014     Your HGBA1c can be lowered with medications, healthy diet, and exercise.  Check your blood sugar as directed by your physician  Call your physician if you experience unexplained or low blood sugars.  PHYSICAL ACTIVITY/REHABILITATION Goal is 30 minutes at least 4 days per week  Activity: No restrictions. Therapies:  Return to work:   Activity decreases your risk of heart attack and stroke and  makes your heart stronger.  It helps control your weight and blood pressure; helps you relax and can improve your mood.  Participate in a regular exercise program.  Talk with your doctor about the best form of exercise for you (dancing, walking, swimming, cycling).  DIET/WEIGHT Goal is to maintain a healthy weight  Your discharge diet is: Cardiac   Your height is:  Height: 5\' 5"  (165.1 cm) Your current weight is: Weight: 77.111 kg (170 lb) Your Body Mass Index (BMI) is:  BMI (Calculated): 28.3  Following the type of diet specifically designed for you will help prevent another stroke.  Your goal weight range is:  Your goal Body Mass Index (BMI) is 19-24.  Healthy food habits can help reduce 3 risk factors for stroke:  High cholesterol, hypertension, and excess weight.  RESOURCES Stroke/Support Group:  Call [phone]   STROKE EDUCATION PROVIDED/REVIEWED AND GIVEN TO PATIENT Stroke warning signs and symptoms How to activate emergency medical system (call 911). Medications prescribed at discharge. Need for follow-up after discharge. Personal risk factors for stroke. Pneumonia vaccine given: No Flu vaccine given: No My questions have been answered, the writing is legible, and I understand these instructions.  I will adhere to these goals & educational materials that have been provided to me after my discharge from the hospital.

## 2014-01-28 NOTE — Progress Notes (Signed)
Patient ID: Sherry Nicholson  female  WUJ:[id number]    DOB: 11/05/1971    DOA: 01/21/2014  PCP: Leanor RubensteinSUN,VYVYAN Y, MD  Assessment/Plan: Principal Problem:   TIA (transient ischemic attack): Risk factors of hypertension, hyperlipidemia, patient had stopped taking her medications due to family issues for the last few months - Symptoms have resolved, MRI of the brain/MRA  still pending - 2-D echo, carotid Dopplers  - Continue aspirin, restarted HCTZ  - Lipid panel showed cholesterol 191, LDL 124, started on statin   Active Problems: Chest pain: Midsternal, currently completely resolved, EKG showed nonspecific ST-T wave changes, BP elevated at 168/100 the time of admission - f/u on Myoviwe     ESSENTIAL HYPERTENSION -  continue hydralazine as needed started on HCTZ 25 mg daily which she used to be on previously and had stopped taking for the last few months     Near syncope - Likely due to #1, continue workup    DVT Prophylaxis:  Code Status:Full code  Family Communication:   Disposition: likely tomorrow   Consultants:   cardiology, Dr. Royann Shiversroitoru   Procedures:   pending   Antibiotics:  none    Subjective: chest pain resolved - no complaints.   Objective: Weight change:  No intake or output data in the 24 hours ending 01/28/14 1655 Blood pressure 130/84, pulse 77, temperature 98 F (36.7 C), temperature source Oral, resp. rate 18, height 5\' 5"  (1.651 m), weight 77.111 kg (170 lb), last menstrual period 12/19/2013, SpO2 100.00%.  Physical Exam: General: Alert and awake, oriented x3, not in any acute distress. CVS: S1-S2 clear, no murmur rubs or gallops Chest: clear to auscultation bilaterally, no wheezing, rales or rhonchi Abdomen: soft nontender, nondistended, normal bowel sounds  Extremities: no cyanosis, clubbing or edema noted bilaterally Neuro: Cranial nerves II-XII intact, no focal neurological deficits  Lab Results: Basic Metabolic Panel:  Recent Labs Lab  01/22/14 0444  NA 137  K 4.0  CL 102  CO2 22  GLUCOSE 93  BUN 7  CREATININE 0.65  CALCIUM 9.2   Liver Function Tests:  Recent Labs Lab 01/22/14 0444  AST 11  ALT 5  ALKPHOS 69  BILITOT 0.5  PROT 7.1  ALBUMIN 3.6   No results found for this basename: LIPASE, AMYLASE,  in the last 168 hours No results found for this basename: AMMONIA,  in the last 168 hours CBC:  Recent Labs Lab 01/22/14 0444  WBC 7.0  HGB 13.1  HCT 37.2  MCV 85.9  PLT 244   Cardiac Enzymes:  Recent Labs Lab 01/21/14 2053 01/22/14 0442 01/22/14 0827  TROPONINI <0.30 <0.30 <0.30   BNP: No components found with this basename: POCBNP,  CBG: No results found for this basename: GLUCAP,  in the last 168 hours   Micro Results: No results found for this or any previous visit (from the past 240 hour(s)).  Studies/Results: Dg Chest 2 View  01/21/2014   CLINICAL DATA:  Chest pain  EXAM: CHEST  2 VIEW  COMPARISON:  None.  FINDINGS: The heart size and mediastinal contours are within normal limits. Both lungs are clear. The visualized skeletal structures are unremarkable.  IMPRESSION: No active cardiopulmonary disease.   Electronically Signed   By: Ruel Favorsrevor  Shick M.D.   On: 01/21/2014 16:07   Ct Head Without Contrast  01/21/2014   CLINICAL DATA:  Hot flashes and sweating.  EXAM: CT HEAD WITHOUT CONTRAST  TECHNIQUE: Contiguous axial images were obtained from the base of the skull  through the vertex without contrast.  COMPARISON:  None  FINDINGS: No evidence for acute hemorrhage, mass lesion, midline shift, hydrocephalus or large infarct. Visualized sinuses are clear. No acute bone abnormality.  IMPRESSION: Negative head CT.   Electronically Signed   By: Richarda Overlie M.D.   On: 01/21/2014 18:34   Ct Angio Chest Pe W/cm &/or Wo Cm  01/21/2014   CLINICAL DATA:  Mid chest pain.  Syncope.  Difficulty breathing.  EXAM: CT ANGIOGRAPHY CHEST WITH CONTRAST  TECHNIQUE: Multidetector CT imaging of the chest was performed  using the standard protocol during bolus administration of intravenous contrast. Multiplanar CT image reconstructions and MIPs were obtained to evaluate the vascular anatomy.  CONTRAST:  OMNIPAQUE IOHEXOL 350 MG/ML SOLN  COMPARISON:  None.  FINDINGS: No filling defects in the pulmonary arteries to suggest pulmonary emboli. Heart is normal size. Aorta is normal caliber. No mediastinal, hilar, or axillary adenopathy. Chest wall soft tissues are unremarkable. Lungs are clear. No focal airspace opacities or suspicious nodules. No effusions. Imaging into the upper abdomen shows no acute findings.  No acute bony abnormality. Degenerative spurring in the mid and lower thoracic spine.  Review of the MIP images confirms the above findings.  IMPRESSION: No evidence of pulmonary embolus.  No acute findings.   Electronically Signed   By: Charlett Nose M.D.   On: 01/21/2014 18:40    Medications: Scheduled Meds:     LOS: 3 days   [facility] M.D. [facility] 01/23/2014, 4:55 PM  If 7PM-7AM, please contact night-coverage [email or web address] Password [email or web address]

## 2014-03-30 DIAGNOSIS — R002 Palpitations: Secondary | ICD-10-CM | POA: Insufficient documentation

## 2014-06-16 ENCOUNTER — Encounter (HOSPITAL_COMMUNITY): Payer: Self-pay | Admitting: Emergency Medicine

## 2014-06-16 ENCOUNTER — Emergency Department (HOSPITAL_COMMUNITY)
Admission: EM | Admit: 2014-06-16 | Discharge: 2014-06-17 | Disposition: A | Discharge status: Home / Self Care | Payer: Self-pay | Attending: Emergency Medicine | Admitting: Emergency Medicine

## 2014-06-16 DIAGNOSIS — F411 Generalized anxiety disorder: Secondary | ICD-10-CM | POA: Insufficient documentation

## 2014-06-16 DIAGNOSIS — R000 Tachycardia, unspecified: Secondary | ICD-10-CM | POA: Insufficient documentation

## 2014-06-16 DIAGNOSIS — F419 Anxiety disorder, unspecified: Secondary | ICD-10-CM

## 2014-06-16 DIAGNOSIS — R4701 Aphasia: Secondary | ICD-10-CM | POA: Insufficient documentation

## 2014-06-16 DIAGNOSIS — Z8673 Personal history of transient ischemic attack (TIA), and cerebral infarction without residual deficits: Secondary | ICD-10-CM | POA: Insufficient documentation

## 2014-06-16 DIAGNOSIS — R5383 Other fatigue: Secondary | ICD-10-CM

## 2014-06-16 DIAGNOSIS — R5381 Other malaise: Secondary | ICD-10-CM | POA: Insufficient documentation

## 2014-06-16 DIAGNOSIS — Z79899 Other long term (current) drug therapy: Secondary | ICD-10-CM | POA: Insufficient documentation

## 2014-06-16 DIAGNOSIS — Z3202 Encounter for pregnancy test, result negative: Secondary | ICD-10-CM | POA: Insufficient documentation

## 2014-06-16 DIAGNOSIS — E7800 Pure hypercholesterolemia, unspecified: Secondary | ICD-10-CM | POA: Insufficient documentation

## 2014-06-16 DIAGNOSIS — I10 Essential (primary) hypertension: Secondary | ICD-10-CM | POA: Insufficient documentation

## 2014-06-16 LAB — I-STAT TROPONIN, ED: TROPONIN I, POC: 0 ng/mL (ref 0.00–0.08)

## 2014-06-16 LAB — CBC WITH DIFFERENTIAL/PLATELET
BASOS PCT: 0 % (ref 0–1)
Basophils Absolute: 0 10*3/uL (ref 0.0–0.1)
EOS ABS: 0.1 10*3/uL (ref 0.0–0.7)
Eosinophils Relative: 1 % (ref 0–5)
HCT: 40.1 % (ref 36.0–46.0)
HEMOGLOBIN: 14.2 g/dL (ref 12.0–15.0)
LYMPHS ABS: 3.3 10*3/uL (ref 0.7–4.0)
Lymphocytes Relative: 37 % (ref 12–46)
MCH: 30.7 pg (ref 26.0–34.0)
MCHC: 35.4 g/dL (ref 30.0–36.0)
MCV: 86.8 fL (ref 78.0–100.0)
MONOS PCT: 8 % (ref 3–12)
Monocytes Absolute: 0.8 10*3/uL (ref 0.1–1.0)
NEUTROS PCT: 54 % (ref 43–77)
Neutro Abs: 4.9 10*3/uL (ref 1.7–7.7)
PLATELETS: 270 10*3/uL (ref 150–400)
RBC: 4.62 MIL/uL (ref 3.87–5.11)
RDW: 13.5 % (ref 11.5–15.5)
WBC: 9.1 10*3/uL (ref 4.0–10.5)

## 2014-06-16 MED ORDER — LORAZEPAM 2 MG/ML IJ SOLN
1.0000 mg | Freq: Once | INTRAMUSCULAR | Status: AC
  Administered 2014-06-16: 1 mg via INTRAVENOUS
  Filled 2014-06-16: qty 1

## 2014-06-16 MED ORDER — SODIUM CHLORIDE 0.9 % IV BOLUS (SEPSIS)
2000.0000 mL | Freq: Once | INTRAVENOUS | Status: AC
  Administered 2014-06-16: 2000 mL via INTRAVENOUS

## 2014-06-16 NOTE — ED Notes (Signed)
Pt presents with c/o stroke like symptoms, weakness on the right side. Per pt's boyfriend, they were in an argument earlier this evening and she was screaming. Boyfriend said that she then stopped responding, weakness on the right side at this time, pt barely able to tell me her first name. Diaphoretic at this time.

## 2014-06-16 NOTE — ED Provider Notes (Signed)
CSN: [id number]     Arrival date & time 06/16/14  2316 History   First MD Initiated Contact with Patient 06/16/14 2326     Chief Complaint  Patient presents with  . Aphasia  . Weakness     (Consider location/radiation/quality/duration/timing/severity/associated sxs/prior Treatment) HPI 43 year old female with history of hypertension and hyperlipidemia was feeling fine this evening when she started having an argument with her significant other and they were yelling at each other and the patient became anxious and angry and then felt generally weak and shaky all over and lightheaded and had a few minutes of shortness of breath and chest pain which she cannot describe otherwise, she and her significant other made up and she sat down in the chair and he brought her to the emergency department to make sure she was okay, upon arrival to the emergency department she appears anxious she is awake alert and has generalized tremors of all 4 extremities and initially did not talk to the nursing staff but as soon as I addressed the patient she was speaking normally to me, she has no chest pain now no shortness of breath now and states she only had them for a few minutes, she denies any headache, she denies change in speech vision swallowing or understanding, she denies any lateralizing weakness or numbness or incoordination, she admits she is very anxious but denies any threats to harm self or others denies suicidal or homicidal ideation or hallucinations. There is no obvious seizure-like activity tonight. In March of this year the patient was admitted for what sounds like a similar spell and was diagnosed with a questionable TIA at that time for an episode of near syncope brief chest pain and shortness of breath and several minutes of not talking then either. She had unremarkable MRI and MRA of her brain at that time. Past Medical History  Diagnosis Date  . Hypertension   . Hypercholesteremia   . TIA (transient  ischemic attack) 01/21/2014  . Daily headache    Past Surgical History  Procedure Laterality Date  . Shoulder arthroscopy w/ rotator cuff repair Right ~ 2000   No family history on file. History  Substance Use Topics  . Smoking status: Never Smoker   . Smokeless tobacco: Never Used  . Alcohol Use: Yes     Comment: 01/21/2014 "might have few drinks 6 times/yr, if that"   OB History   Grav Para Term Preterm Abortions TAB SAB Ect Mult Living                 Review of Systems 10 Systems reviewed and are negative for acute change except as noted in the HPI.   Allergies  Coconut flavor and Cortisone acetate  Home Medications   Prior to Admission medications   Medication Sig Start Date End Date Taking? Authorizing Provider  hydrochlorothiazide (HYDRODIURIL) 25 MG tablet Take 1 tablet (25 mg total) by mouth daily. 01/24/14  Yes Ripudeep Jenna Luo, MD  simvastatin (ZOCOR) 20 MG tablet Take 1 tablet (20 mg total) by mouth at bedtime. 01/24/14  Yes Ripudeep K Rai, MD   BP 101/61  Pulse 94  Temp(Src) 97.9 F (36.6 C) (Oral)  Resp 14  Ht 5\' 5"  (1.651 m)  Wt 164 lb (74.39 kg)  BMI 27.29 kg/m2  SpO2 100% Physical Exam  Nursing note and vitals reviewed. Constitutional: She is oriented to person, place, and time.  Awake, alert, nontoxic appearance with baseline speech for patient.  HENT:  Head: Atraumatic.  Mouth/Throat: No oropharyngeal exudate.  Eyes: EOM are normal. Pupils are equal, round, and reactive to light. Right eye exhibits no discharge. Left eye exhibits no discharge.  Neck: Neck supple.  Cardiovascular: Regular rhythm.   No murmur heard. Tachycardic  Pulmonary/Chest: Effort normal and breath sounds normal. No stridor. No respiratory distress. She has no wheezes. She has no rales. She exhibits no tenderness.  Abdominal: Soft. Bowel sounds are normal. She exhibits no mass. There is no tenderness. There is no rebound.  Musculoskeletal: She exhibits no edema and no tenderness.   Baseline ROM, moves extremities with no obvious new focal weakness.  Lymphadenopathy:    She has no cervical adenopathy.  Neurological: She is alert and oriented to person, place, and time.  Awake, alert, cooperative and aware of situation; motor strength 5/5 bilaterally; sensation normal to light touch bilaterally; peripheral visual fields full to confrontation; no facial asymmetry; tongue midline; major cranial nerves appear intact; no pronator drift, normal finger to nose bilaterally  Skin: No rash noted.  Psychiatric:  Anxious    ED Course  Procedures (including critical care time) Patient / Family / Caregiver understand and agree with initial ED impression and plan with expectations set for ED visit.Hand-off to Dr. Read DriversMolpus. Labs Review Labs Reviewed  BASIC METABOLIC PANEL - Abnormal; Notable for the following:    Sodium 133 (*)    Potassium 3.4 (*)    Chloride 94 (*)    Glucose, Bld 109 (*)    GFR calc non Af Amer 86 (*)    Anion gap 20 (*)    All other components within normal limits  CBC WITH DIFFERENTIAL  I-STAT TROPOININ, ED  POC URINE PREG, ED  I-STAT TROPOININ, ED  I-STAT TROPOININ, ED    Imaging Review No results found.   EKG Interpretation   Date/Time:  Thursday June 16 2014 23:22:15 EDT Ventricular Rate:  130 PR Interval:  153 QRS Duration: 61 QT Interval:  299 QTC Calculation: 440 R Axis:   65 Text Interpretation:  Sinus tachycardia Probable left atrial enlargement  Abnormal R-wave progression, early transition Compared to previous tracing  Rate faster Confirmed by Fonnie JarvisBEDNAR  MD, Jonny RuizJOHN ([id number]) on 06/16/2014 11:52:19 PM      MDM   Final diagnoses:  Anxiety    Dispo pnd.    Hurman HornJohn M Nylee Barbuto, MD 06/17/14 2107

## 2014-06-17 LAB — BASIC METABOLIC PANEL
ANION GAP: 20 — AB (ref 5–15)
BUN: 12 mg/dL (ref 6–23)
CHLORIDE: 94 meq/L — AB (ref 96–112)
CO2: 19 mEq/L (ref 19–32)
Calcium: 9.9 mg/dL (ref 8.4–10.5)
Creatinine, Ser: 0.83 mg/dL (ref 0.50–1.10)
GFR calc non Af Amer: 86 mL/min — ABNORMAL LOW (ref >90)
Glucose, Bld: 109 mg/dL — ABNORMAL HIGH (ref 70–99)
POTASSIUM: 3.4 meq/L — AB (ref 3.7–5.3)
SODIUM: 133 meq/L — AB (ref 137–147)

## 2014-06-17 LAB — POC URINE PREG, ED: Preg Test, Ur: NEGATIVE

## 2014-06-17 LAB — I-STAT TROPONIN, ED
TROPONIN I, POC: 0 ng/mL (ref 0.00–0.08)
Troponin i, poc: 0 ng/mL (ref 0.00–0.08)

## 2014-06-17 NOTE — Discharge Instructions (Signed)

## 2014-06-17 NOTE — ED Provider Notes (Signed)
Nursing notes and vitals signs, including pulse oximetry, reviewed.  Summary of this visit's results, reviewed by myself:  Labs:  Results for orders placed during the hospital encounter of 06/16/14 (from the past 24 hour(s))  BASIC METABOLIC PANEL     Status: Abnormal   Collection Time    06/16/14 11:39 PM      Result Value Ref Range   Sodium 133 (*) 137 - 147 mEq/L   Potassium 3.4 (*) 3.7 - 5.3 mEq/L   Chloride 94 (*) 96 - 112 mEq/L   CO2 19  19 - 32 mEq/L   Glucose, Bld 109 (*) 70 - 99 mg/dL   BUN 12  6 - 23 mg/dL   Creatinine, Ser 1.610.83  0.50 - 1.10 mg/dL   Calcium 9.9  8.4 - 09.610.5 mg/dL   GFR calc non Af Amer 86 (*) >90 mL/min   GFR calc Af Amer >90  >90 mL/min   Anion gap 20 (*) 5 - 15  CBC WITH DIFFERENTIAL     Status: None   Collection Time    06/16/14 11:39 PM      Result Value Ref Range   WBC 9.1  4.0 - 10.5 K/uL   RBC 4.62  3.87 - 5.11 MIL/uL   Hemoglobin 14.2  12.0 - 15.0 g/dL   HCT 04.540.1  40.936.0 - 81.146.0 %   MCV 86.8  78.0 - 100.0 fL   MCH 30.7  26.0 - 34.0 pg   MCHC 35.4  30.0 - 36.0 g/dL   RDW 91.413.5  78.211.5 - 95.615.5 %   Platelets 270  150 - 400 K/uL   Neutrophils Relative % 54  43 - 77 %   Neutro Abs 4.9  1.7 - 7.7 K/uL   Lymphocytes Relative 37  12 - 46 %   Lymphs Abs 3.3  0.7 - 4.0 K/uL   Monocytes Relative 8  3 - 12 %   Monocytes Absolute 0.8  0.1 - 1.0 K/uL   Eosinophils Relative 1  0 - 5 %   Eosinophils Absolute 0.1  0.0 - 0.7 K/uL   Basophils Relative 0  0 - 1 %   Basophils Absolute 0.0  0.0 - 0.1 K/uL  I-STAT TROPOININ, ED     Status: None   Collection Time    06/16/14 11:49 PM      Result Value Ref Range   Troponin i, poc 0.00  0.00 - 0.08 ng/mL   Comment 3           POC URINE PREG, ED     Status: None   Collection Time    06/17/14 12:05 AM      Result Value Ref Range   Preg Test, Ur NEGATIVE  NEGATIVE  I-STAT TROPOININ, ED     Status: None   Collection Time    06/17/14  2:46 AM      Result Value Ref Range   Troponin i, poc 0.00  0.00 - 0.08 ng/mL    Comment 3           I-STAT TROPOININ, ED     Status: None   Collection Time    06/17/14  5:33 AM      Result Value Ref Range   Troponin i, poc 0.00  0.00 - 0.08 ng/mL   Comment 3              Hanley SeamenJohn L Lynn Recendiz, MD 06/17/14 [phone]

## 2014-06-22 ENCOUNTER — Emergency Department (INDEPENDENT_AMBULATORY_CARE_PROVIDER_SITE_OTHER)
Admission: EM | Admit: 2014-06-22 | Discharge: 2014-06-22 | Disposition: A | Discharge status: Home / Self Care | Payer: Self-pay | Source: Home / Self Care | Attending: Family Medicine | Admitting: Family Medicine

## 2014-06-22 ENCOUNTER — Encounter (HOSPITAL_COMMUNITY): Payer: Self-pay | Admitting: Emergency Medicine

## 2014-06-22 DIAGNOSIS — S0511XA Contusion of eyeball and orbital tissues, right eye, initial encounter: Secondary | ICD-10-CM

## 2014-06-22 DIAGNOSIS — S0510XA Contusion of eyeball and orbital tissues, unspecified eye, initial encounter: Secondary | ICD-10-CM

## 2014-06-22 MED ORDER — DICLOFENAC SODIUM 0.1 % OP SOLN: 1.0000 [drp] | Freq: Three times a day (TID) | OPHTHALMIC | Status: DC

## 2014-06-22 MED ORDER — TETRACAINE HCL 0.5 % OP SOLN
OPHTHALMIC | Status: AC
  Filled 2014-06-22: qty 2
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
## 2014-06-22 NOTE — Discharge Instructions (Signed)
I have contacted the ophthalmologist on call (Dr. Shade FloodGreer Geiger) and requested that office contact you with follow up appointment to be seen either today or tomorrow. I was informed by office staff that they would contact you today to set up appointment. If you have not heard from office by this afternoon, please call office to arrange appointment. May use eye drops as prescribed for pain. If symptoms become suddenly worse or severe, seek treatment at your nearest ER.  Eye Contusion An eye contusion is a deep bruise of the eye. This is often called a "black eye." Contusions are the result of an injury that caused bleeding under the skin. The contusion may turn blue, purple, or yellow. Minor injuries will give you a painless contusion, but more severe contusions may stay painful and swollen for a few weeks. If the eye contusion only involves the eyelids and tissues around the eye, the injured area will get better within a few days to weeks. However, eye contusions can be serious and affect the eyeball and sight. CAUSES   Blunt injury or trauma to the face or eye area.  A forehead injury that causes the blood under the skin to work its way down to the eyelids.  Rubbing the eyes due to irritation. SYMPTOMS   Swelling and redness around the eye.  Bruising around the eye.  Tenderness, soreness, or pain around the eye.  Blurry vision.  Tearing.  Eyeball redness. DIAGNOSIS  A diagnosis is usually based on a thorough exam of the eye and surrounding area. The eye must be looked at carefully to make sure it is not injured and to make sure nothing else will threaten your vision. A vision test may be done. An X-ray or computed tomography (CT) scan may be needed to determine if there are any associated injuries, such as broken bones (fractures). TREATMENT  If there is an injury to the eye, treatment will be determined by the nature of the injury. HOME CARE INSTRUCTIONS   Put ice on the injured  area.  Put ice in a plastic bag.  Place a towel between your skin and the bag.  Leave the ice on for 15-20 minutes, 03-04 times a day.  If it is determined that there is no injury to the eye, you may continue normal activities.  Sunglasses may be worn to protect your eyes from bright light if light is uncomfortable.  Sleep with your head elevated. You can put an extra pillow under your head. This may help with discomfort.  Only take over-the-counter or prescription medicines for pain, discomfort, or fever as directed by your caregiver. Do not take aspirin for the first few days. This may increase bruising. SEEK IMMEDIATE MEDICAL CARE IF:   You have any form of vision loss.  You have double vision.  You feel nauseous.  You feel dizzy, sleepy, or like you will faint.  You have any fluid discharge from the eye or your nose.  You have swelling and discoloration that does not fade. MAKE SURE YOU:   Understand these instructions.  Will watch your condition.  Will get help right away if you are not doing well or get worse. Document Released: 11/01/2000 Document Revised: 01/27/2012 Document Reviewed: 09/20/2011 [facility] Patient Information 2015 [place]. This information is not intended to replace advice given to you by your health care provider. Make sure you discuss any questions you have with your health care provider.

## 2014-06-22 NOTE — ED Provider Notes (Signed)
Medical screening examination/treatment/procedure(s) were performed by a resident physician or non-physician practitioner and as the supervising physician I was immediately available for consultation/collaboration.  Kella Splinter, MD Family Medicine   Pierce Biagini J Amore Ackman, MD 06/22/14 1219 

## 2014-09-07 DIAGNOSIS — R630 Anorexia: Secondary | ICD-10-CM | POA: Insufficient documentation

## 2014-09-07 DIAGNOSIS — Z113 Encounter for screening for infections with a predominantly sexual mode of transmission: Secondary | ICD-10-CM | POA: Insufficient documentation

## 2014-09-07 DIAGNOSIS — M79602 Pain in left arm: Secondary | ICD-10-CM | POA: Insufficient documentation

## 2014-09-07 DIAGNOSIS — R29898 Other symptoms and signs involving the musculoskeletal system: Secondary | ICD-10-CM | POA: Insufficient documentation

## 2015-10-05 ENCOUNTER — Encounter (HOSPITAL_COMMUNITY): Payer: Self-pay | Admitting: Neurology

## 2015-10-05 ENCOUNTER — Emergency Department (HOSPITAL_COMMUNITY)
Admission: EM | Admit: 2015-10-05 | Discharge: 2015-10-06 | Disposition: A | Discharge status: Home / Self Care | Payer: Self-pay | Attending: Emergency Medicine | Admitting: Emergency Medicine

## 2015-10-05 DIAGNOSIS — R112 Nausea with vomiting, unspecified: Secondary | ICD-10-CM

## 2015-10-05 DIAGNOSIS — I10 Essential (primary) hypertension: Secondary | ICD-10-CM | POA: Insufficient documentation

## 2015-10-05 DIAGNOSIS — K2970 Gastritis, unspecified, without bleeding: Secondary | ICD-10-CM

## 2015-10-05 DIAGNOSIS — Z8673 Personal history of transient ischemic attack (TIA), and cerebral infarction without residual deficits: Secondary | ICD-10-CM | POA: Insufficient documentation

## 2015-10-05 DIAGNOSIS — Z8639 Personal history of other endocrine, nutritional and metabolic disease: Secondary | ICD-10-CM | POA: Insufficient documentation

## 2015-10-05 LAB — COMPREHENSIVE METABOLIC PANEL
ALT: 11 U/L — AB (ref 14–54)
AST: 16 U/L (ref 15–41)
Albumin: 3.9 g/dL (ref 3.5–5.0)
Alkaline Phosphatase: 69 U/L (ref 38–126)
Anion gap: 9 (ref 5–15)
BUN: 7 mg/dL (ref 6–20)
CALCIUM: 9.4 mg/dL (ref 8.9–10.3)
CO2: 19 mmol/L — AB (ref 22–32)
Chloride: 106 mmol/L (ref 101–111)
Creatinine, Ser: 0.64 mg/dL (ref 0.44–1.00)
GLUCOSE: 97 mg/dL (ref 65–99)
Potassium: 3.7 mmol/L (ref 3.5–5.1)
Sodium: 134 mmol/L — ABNORMAL LOW (ref 135–145)
Total Bilirubin: 0.6 mg/dL (ref 0.3–1.2)
Total Protein: 7.4 g/dL (ref 6.5–8.1)

## 2015-10-05 LAB — URINALYSIS, ROUTINE W REFLEX MICROSCOPIC
BILIRUBIN URINE: NEGATIVE
Glucose, UA: NEGATIVE mg/dL
KETONES UR: 15 mg/dL — AB
Nitrite: NEGATIVE
PH: 5 (ref 5.0–8.0)
Protein, ur: NEGATIVE mg/dL
SPECIFIC GRAVITY, URINE: 1.028 (ref 1.005–1.030)

## 2015-10-05 LAB — URINE MICROSCOPIC-ADD ON

## 2015-10-05 LAB — CBC
HEMATOCRIT: 42.8 % (ref 36.0–46.0)
HEMOGLOBIN: 14.8 g/dL (ref 12.0–15.0)
MCH: 30 pg (ref 26.0–34.0)
MCHC: 34.6 g/dL (ref 30.0–36.0)
MCV: 86.6 fL (ref 78.0–100.0)
Platelets: 260 10*3/uL (ref 150–400)
RBC: 4.94 MIL/uL (ref 3.87–5.11)
RDW: 13.1 % (ref 11.5–15.5)
WBC: 7.1 10*3/uL (ref 4.0–10.5)

## 2015-10-05 LAB — I-STAT BETA HCG BLOOD, ED (MC, WL, AP ONLY): I-stat hCG, quantitative: <5 m[IU]/mL (ref <5)

## 2015-10-05 LAB — LIPASE, BLOOD: LIPASE: 16 U/L (ref 11–51)

## 2015-10-05 MED ORDER — PANTOPRAZOLE SODIUM 40 MG PO TBEC
40.0000 mg | DELAYED_RELEASE_TABLET | Freq: Once | ORAL | Status: AC
  Administered 2015-10-05: 40 mg via ORAL
  Filled 2015-10-05: qty 1

## 2015-10-05 MED ORDER — SODIUM CHLORIDE 0.9 % IV BOLUS (SEPSIS)
1000.0000 mL | Freq: Once | INTRAVENOUS | Status: AC
  Administered 2015-10-05: 1000 mL via INTRAVENOUS

## 2015-10-05 MED ORDER — ONDANSETRON HCL 4 MG/2ML IJ SOLN
4.0000 mg | Freq: Once | INTRAMUSCULAR | Status: AC
  Administered 2015-10-05: 4 mg via INTRAVENOUS
  Filled 2015-10-05: qty 2

## 2015-10-05 MED ORDER — MORPHINE SULFATE (PF) 2 MG/ML IV SOLN
2.0000 mg | Freq: Once | INTRAVENOUS | Status: AC
  Administered 2015-10-05: 2 mg via INTRAVENOUS
  Filled 2015-10-05: qty 1

## 2015-10-05 NOTE — ED Provider Notes (Signed)
CSN: [id number]     Arrival date & time 10/05/15  1749 History   First MD Initiated Contact with Patient 10/05/15 2046     Chief Complaint  Patient presents with  . Abdominal Cramping  . Emesis     (Consider location/radiation/quality/duration/timing/severity/associated sxs/prior Treatment) The history is provided by the patient and medical records. No language interpreter was used.    Sherry Nicholson is a 44 y.o. female  with a hx of HTN, TIA, high cholesterol and headaches presents to the Emergency Department complaining of gradual, persistent, progressively worsening upper and generalized abdominal cramps onset 9 am. Associated symptoms include multiple BMs but no diarrhea or loose stools.  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  No melena or hematochezia.  Pt reports she began vomiting stomach contents at 3pm; no blood or bile.  Pt reports she was able to eat a small amount of food around 7pm without vomiting.  Pt denies fever, chills, headache, neck pain, chest pain, SOB, weakness, dizziness, syncope, vaginal discharge, lower abd pain.  Patient reports normal bowel movements without diarrhea. Pt reports she felt lightheaded this morning, but this resolved and did not return.  No syncope.  Pt denies abdominal surgery.        Past Medical History  Diagnosis Date  . Hypertension   . Hypercholesteremia   . TIA (transient ischemic attack) 01/21/2014  . Daily headache    Past Surgical History  Procedure Laterality Date  . Shoulder arthroscopy w/ rotator cuff repair Right ~ 2000   No family history on file. Social History  Substance Use Topics  . Smoking status: Never Smoker   . Smokeless tobacco: Never Used  . Alcohol Use: Yes     Comment: 01/21/2014 "might have few drinks 6 times/yr, if that"   OB History    No data available     Review of Systems  Constitutional: Negative for fever, diaphoresis, appetite change, fatigue and  unexpected weight change.  HENT: Negative for mouth sores and trouble swallowing.   Eyes: Negative for visual disturbance.  Respiratory: Negative for cough, chest tightness, shortness of breath, wheezing and stridor.   Cardiovascular: Negative for chest pain and palpitations.  Gastrointestinal: Positive for nausea, vomiting and abdominal pain ( epigastric). Negative for diarrhea, constipation, blood in stool, abdominal distention and rectal pain.  Endocrine: Negative for polydipsia, polyphagia and polyuria.  Genitourinary: Negative for dysuria, urgency, frequency, hematuria, flank pain and difficulty urinating.  Musculoskeletal: Negative for back pain, neck pain and neck stiffness.  Skin: Negative for rash.  Allergic/Immunologic: Negative for immunocompromised state.  Neurological: Negative for syncope, weakness, light-headedness and headaches.  Hematological: Negative for adenopathy. Does not bruise/bleed easily.  Psychiatric/Behavioral: Negative for confusion and sleep disturbance. The patient is not nervous/anxious.   All other systems reviewed and are negative.     Allergies  Coconut flavor and Cortisone acetate  Home Medications   Prior to Admission medications   Medication Sig Start Date End Date Taking? Authorizing Provider  hydrochlorothiazide (HYDRODIURIL) 25 MG tablet Take 1 tablet (25 mg total) by mouth daily. 01/24/14  Yes Ripudeep Jenna LuoK Rai, MD  simvastatin (ZOCOR) 20 MG tablet Take 1 tablet (20 mg total) by mouth at bedtime. 01/24/14  Yes Ripudeep Jenna LuoK Rai, MD  ondansetron (ZOFRAN ODT) 8 MG disintegrating tablet 8mg  ODT q4 hours prn nausea 10/06/15   Mekayla Soman, PA-C   BP 131/79 mmHg  Pulse 92  Temp(Src) 99.8 F (37.7  C) (Oral)  Resp 14  SpO2 100%  LMP 09/19/2015 Physical Exam  Constitutional: She appears well-developed and well-nourished. No distress.  Awake, alert, nontoxic appearance  HENT:  Head: Normocephalic and atraumatic.  Mouth/Throat: Oropharynx is  clear and moist. No oropharyngeal exudate.  Eyes: Conjunctivae are normal. No scleral icterus.  Neck: Normal range of motion. Neck supple.  Cardiovascular: Normal rate, regular rhythm and intact distal pulses.   Pulmonary/Chest: Effort normal and breath sounds normal. No respiratory distress. She has no wheezes.  Equal chest expansion  Abdominal: Soft. Bowel sounds are normal. She exhibits no distension and no mass. There is tenderness in the right upper quadrant, epigastric area and left upper quadrant. There is no rebound, no guarding and no CVA tenderness.  Mild, generalized epigastric tenderness without guarding or rebound No CVA tenderness  Musculoskeletal: Normal range of motion. She exhibits no edema.  Neurological: She is alert.  Speech is clear and goal oriented Moves extremities without ataxia  Skin: Skin is warm and dry. She is not diaphoretic.  Psychiatric: She has a normal mood and affect.  Nursing note and vitals reviewed.   ED Course  Procedures (including critical care time) Labs Review Labs Reviewed  COMPREHENSIVE METABOLIC PANEL - Abnormal; Notable for the following:    Sodium 134 (*)    CO2 19 (*)    ALT 11 (*)    All other components within normal limits  URINALYSIS, ROUTINE W REFLEX MICROSCOPIC (NOT AT [facility]) - Abnormal; Notable for the following:    APPearance CLOUDY (*)    Hgb urine dipstick MODERATE (*)    Ketones, ur 15 (*)    Leukocytes, UA SMALL (*)    All other components within normal limits  URINE MICROSCOPIC-ADD ON - Abnormal; Notable for the following:    Squamous Epithelial / LPF 0-5 (*)    Bacteria, UA MANY (*)    All other components within normal limits  LIPASE, BLOOD  CBC  I-STAT BETA HCG BLOOD, ED ([facility], [facility], [facility] ONLY)    Imaging Review No results found. I have personally reviewed and evaluated these images and lab results as part of my medical decision-making.   EKG Interpretation None      MDM   Final diagnoses:  Gastritis   Non-intractable vomiting with nausea, vomiting of unspecified type    Cruz Shifflet presents with gastritis symptoms.  Likely viral in nature as pt has been exposed to same.  Vitals are stable, no fever or tachycardia.  Patient is nontoxic, nonseptic appearing, in no apparent distress.  Patient does not meet the SIRS or Sepsis criteria.  Pt's symptoms have been managed in the department; fluid bolus given.  No signs of dehydration, tolerating PO fluids > 6 oz.  Lungs are clear.  No focal abdominal pain, no peritoneal signs, no concern for appendicitis, cholecystitis, pancreatitis, ruptured viscus, UTI, kidney stone, PID, ectopic pregnancy.  Supportive therapy indicated.  Patient counseled, expresses understanding and agrees with plan.  BP 131/79 mmHg  Pulse 92  Temp(Src) 99.8 F (37.7 C) (Oral)  Resp 14  SpO2 100%  LMP 09/19/2015    Dierdre Forth, PA-C 10/06/15 0106  Lorre Nick, MD 10/09/15 1226

## 2015-10-05 NOTE — ED Notes (Signed)
Pt reports this morning at 0900 abdominal cramps, had several BMs but never had diarrhea. 1500 vomiting started. Reports abdominal cramping.

## 2015-10-06 MED ORDER — ONDANSETRON 8 MG PO TBDP: ORAL_TABLET | ORAL | Status: DC

## 2015-10-06 NOTE — Discharge Instructions (Signed)
1. Medications: zofran, usual home medications °2. Treatment: rest, drink plenty of fluids, advance diet slowly °3. Follow Up: Please followup with your primary doctor in 2 days for discussion of your diagnoses and further evaluation after today's visit; if you do not have a primary care doctor use the resource guide provided to find one; Please return to the ER for persistent vomiting, high fevers or worsening symptoms ° ° ° °Gastritis, Adult °Gastritis is soreness and swelling (inflammation) of the lining of the stomach. Gastritis can develop as a sudden onset (acute) or long-term (chronic) condition. If gastritis is not treated, it can lead to stomach bleeding and ulcers. °CAUSES  °Gastritis occurs when the stomach lining is weak or damaged. Digestive juices from the stomach then inflame the weakened stomach lining. The stomach lining may be weak or damaged due to viral or bacterial infections. One common bacterial infection is the Helicobacter pylori infection. Gastritis can also result from excessive alcohol consumption, taking certain medicines, or having too much acid in the stomach.  °SYMPTOMS  °In some cases, there are no symptoms. When symptoms are present, they may include: °· Pain or a burning sensation in the upper abdomen. °· Nausea. °· Vomiting. °· An uncomfortable feeling of fullness after eating. °DIAGNOSIS  °Your caregiver may suspect you have gastritis based on your symptoms and a physical exam. To determine the cause of your gastritis, your caregiver may perform the following: °· Blood or stool tests to check for the H pylori bacterium. °· Gastroscopy. A thin, flexible tube (endoscope) is passed down the esophagus and into the stomach. The endoscope has a light and camera on the end. Your caregiver uses the endoscope to view the inside of the stomach. °· Taking a tissue sample (biopsy) from the stomach to examine under a microscope. °TREATMENT  °Depending on the cause of your gastritis, medicines  may be prescribed. If you have a bacterial infection, such as an H pylori infection, antibiotics may be given. If your gastritis is caused by too much acid in the stomach, H2 blockers or antacids may be given. Your caregiver may recommend that you stop taking aspirin, ibuprofen, or other nonsteroidal anti-inflammatory drugs (NSAIDs). °HOME CARE INSTRUCTIONS °· Only take over-the-counter or prescription medicines as directed by your caregiver. °· If you were given antibiotic medicines, take them as directed. Finish them even if you start to feel better. °· Drink enough fluids to keep your urine clear or pale yellow. °· Avoid foods and drinks that make your symptoms worse, such as: °¨ Caffeine or alcoholic drinks. °¨ Chocolate. °¨ Peppermint or mint flavorings. °¨ Garlic and onions. °¨ Spicy foods. °¨ Citrus fruits, such as oranges, lemons, or limes. °¨ Tomato-based foods such as sauce, chili, salsa, and pizza. °¨ Fried and fatty foods. °· Eat small, frequent meals instead of large meals. °SEEK IMMEDIATE MEDICAL CARE IF:  °· You have black or dark red stools. °· You vomit blood or material that looks like coffee grounds. °· You are unable to keep fluids down. °· Your abdominal pain gets worse. °· You have a fever. °· You do not feel better after 1 week. °· You have any other questions or concerns. °MAKE SURE YOU: °· Understand these instructions. °· Will watch your condition. °· Will get help right away if you are not doing well or get worse. °  °This information is not intended to replace advice given to you by your health care provider. Make sure you discuss any questions you have with   your health care provider. °  °Document Released: 10/29/2001 Document Revised: 05/05/2012 Document Reviewed: 12/18/2011 °Elsevier Interactive Patient Education ©2016 Elsevier Inc. ° ° ° °Emergency Department Resource Guide °1) Find a Doctor and Pay Out of Pocket °Although you won't have to find out who is covered by your insurance  plan, it is a good idea to ask around and get recommendations. You will then need to call the office and see if the doctor you have chosen will accept you as a new patient and what types of options they offer for patients who are self-pay. Some doctors offer discounts or will set up payment plans for their patients who do not have insurance, but you will need to ask so you aren't surprised when you get to your appointment. ° °2) Contact Your Local Health Department °Not all health departments have doctors that can see patients for sick visits, but many do, so it is worth a call to see if yours does. If you don't know where your local health department is, you can check in your phone book. The CDC also has a tool to help you locate your state's health department, and many state websites also have listings of all of their local health departments. ° °3) Find a Walk-in Clinic °If your illness is not likely to be very severe or complicated, you may want to try a walk in clinic. These are popping up all over the country in pharmacies, drugstores, and shopping centers. They're usually staffed by nurse practitioners or physician assistants that have been trained to treat common illnesses and complaints. They're usually fairly quick and inexpensive. However, if you have serious medical issues or chronic medical problems, these are probably not your best option. ° °No Primary Care Doctor: °- Call [facility] at  832-8000 - they can help you locate a primary care doctor that  accepts your insurance, provides certain services, etc. °- Physician Referral Service- 1-800-533-3463 ° °Chronic Pain Problems: °Organization         Address  Phone   Notes  °[facility]  (336) 297-2271 Patients need to be referred by their primary care doctor.  ° °Medication Assistance: °Organization         Address  Phone   Notes  °[place] [facility] [address] °[place], [place] [address]  (336) 641-8030 --Must be a resident of [place] °-- Must have NO insurance coverage whatsoever (no Medicaid/ Medicare, etc.) °-- The pt. MUST have a primary care doctor that directs their care regularly and follows them in the community °  °[facility]  (866) 331-1348   °[employer]  (888) 892-1162   ° °Agencies that provide inexpensive medical care: °Organization         Address  Phone   Notes  °[facility]  (336) 832-8035   °[facility]    (336) 832-7272   °[facility] [address] °[place], [place] [address] (336) 832-4777   °[facility] [address], °[place] (336) 271-4999   °[facility]    (336) 373-0678   °[facility]    (336) 272-1050   °[facility] ° [address], [place] Phone:  (336) 832-4444, Fax:  (336) 832-4440 Hours of Operation:  9 am - 6 pm, M-F.  Also accepts Medicaid/Medicare and self-pay.  °[facility] ° [address], [place] Phone: (  336) 832-3150, Fax: (336) 832-3151. Hours of Operation:  8:30 am - 5:30 pm, M-F.  Also accepts Medicaid and self-pay.  °[facility] [place] [address], [place] Phone: (336) 878-6027   °[facility] [address], [place], [place] (336)723-1848, Ext. 123 Mondays & Thursdays: 7-9 AM.  First 15 patients are seen on a first come, first serve basis. °  ° °Medicaid-accepting [place] Providers: ° °Organization         Address  Phone   Notes  °[facility] [address], [place] (336) 641-2100 Also accepts self-pay patients.  °[facility] [address], [place] ° (336) 856-9996   °[facility] [address], [place] (336) 288-8857   °[facility] [address] [place] [address], [place] (336) 299-7000   °Veita Bland [address], [place]  ° (336)  373-1557 Only accepts [place] Access Medicaid patients after they have their name applied to their card.  ° °Self-Pay (no insurance) in [place]: ° °Organization         Address  Phone   Notes  °Sickle Cell Patients, [facility] [address], [place] (336) 832-1970   °[facility] [address], [place] (336) 832-4400   °[facility] ° [address], [place] (336) 992-4800   °[facility]/Dr. Osei-Bonsu ° [address] [place] [address], [place] or [address], [place] (336) 841-8500 Phone number for both [place] and [place] locations is the same.  °[facility] [address], [place] (336) 299-0000   °[facility] [address] [place] [address], [place] or [address] (336) 852-7530 °(336) 878-2260   °[facility] [address], [place] (336) 350-1642, phone; (336) 294-5005, fax Sees patients 1st and 3rd Saturday of every month.  Must not qualify for public or private insurance (i.e. Medicaid, Medicare, [place] Health Choice, Veterans' Benefits) • Household income should be no more than 200% of the poverty level •The clinic cannot treat you if you are pregnant or think you are pregnant • Sexually transmitted diseases are not treated at the clinic.  ° ° °Dental Care: °Organization         Address  Phone  Notes  °[facility] [facility] [address], [place] (336) 641-6152 Accepts children up to age 21 who are enrolled in Medicaid or [place] Health Choice; pregnant women with a Medicaid card; and children who have applied for Medicaid or [place] Health Choice, but were declined, whose parents can pay a reduced fee at time of service.  °[facility] [place]  [address], [place] (336) 641-7733 Accepts children up to age 21 who are enrolled in Medicaid or [place] Health  Choice; pregnant women with a Medicaid card; and children who have applied for Medicaid or  Health Choice, but were declined, whose parents can pay a reduced fee at time of service.  °[facility] ° [address], [place] (336) 641-4533 Patients are seen by appointment only. Walk-ins are not accepted. [facility] will see patients 18 years of age and older. °Monday - Tuesday (8am-5pm) °Most Wednesdays (8:30-5pm) °$30 per visit, cash only  °[facility] ° [address],   [place] (336) 641-4533 Patients are seen by appointment only. Walk-ins are not accepted. [facility] will see patients 18 years of age and older. °One Wednesday Evening (Monthly: Volunteer Based).  $30 per visit, cash only  °[facility]  (919) 537-3737 for adults; Children under age 4, call Graduate Pediatric Dentistry at ([phone]) [phone]. Children aged 4-14, please call (919) [phone] to request a pediatric application. ° Dental services are provided in all areas of dental care including fillings, crowns and bridges, complete and partial dentures, implants, gum treatment, root canals, and extractions. Preventive care is also provided. Treatment is provided to both adults and children. °Patients are selected via a lottery and there is often a waiting list. °  °[facility] [address], °[place] ° (336) 763-8833 [email or web address] °  °[facility] [address], [place], [place] (336)723-1848, Ext. 123 Second and Fourth Thursday of each month, opens at 6:30 AM; Clinic ends at 9 AM.  Patients are seen on a first-come first-served basis, and a limited number are seen during each clinic.  ° °[facility] ° [address], [place], [place] (336) 723-7904   Eligibility Requirements °You must have lived in [place], [place], or [place] counties for at least the last three months. °  You cannot be eligible for state or  federal sponsored healthcare insurance, including Veterans Administration, Medicaid, or Medicare. °  You generally cannot be eligible for healthcare insurance through your employer.  °  How to apply: °Eligibility screenings are held every Tuesday and Wednesday afternoon from 1:00 pm until 4:00 pm. You do not need an appointment for the interview!  °[facility] [address], [place], [place] 336-631-2330   °[facility]  336-342-8273   °[facility]  336-703-3100   °[facility]  336-570-6415   ° °Behavioral Health Resources in the Community: °Intensive Outpatient Programs °Organization         Address  Phone  Notes  °[facility] [address], [place], [place] 336-878-6098   °[facility] [address], [place], [place] 336-832-9800   °[facility] [address], [place], [place] ° 336-882-2125   °[facility] [address],  °[place], [place] 1-800-853-5163 or 336-641-4981   °Substance Abuse Resources °Organization         Address  Phone  Notes  °[facility]  336-882-2125   °[facility]  336-784-9470   °[facility]  336-285-9073   °[facility]  336-845-3988   °Residential & Outpatient Substance Abuse Program  1-800-659-3381   °Psychological Services °Organization         Address  Phone  Notes  °[facility]  336- 832-9600   °[facility]  336- 378-7881   °[facility] [address], [place] 1-800-853-5163 or 336-641-4981   ° °Mobile Crisis Teams °Organization         Address  Phone  Notes  °[facility], Mobile Crisis Care Unit  1-877-626-1772   °Assertive °[facility] ° [address] [place], [place] 336-834-9664   °Sharon DeEsch [address] °[place]  336-554-5454   ° °Self-Help/Support Groups °Organization         Address  Phone              Notes  °[facility]  - variety of support   groups  336- 373-1402 Call for more information  °Narcotics Anonymous (NA), [facility] [address], °[place]  2 meetings at this location  ° °Residential Treatment Programs °Organization         Address  Phone  Notes  °[facility] [address],    °[place]  1-866-801-8205   °[facility] ° [address], [place], [place] 704-293-8524   °[facility] [address], [place] 336-845-3988 Admissions: 8am-3pm M-F  °[facility] [address]    °[place], [place] 336-841-1104   °[facility] [address], [place], [place] 336-379-7146   °[facility] [address]  °[place], [place] 336-285-9073   °[facility] Intensive Outpatient [address], [place], [place] 336-852-3033   °[facility] [address]  °[place], [place] 1-877-615-2722 or 336-784-9470   °[facility] [address] [place], [place] 336-227-7417 Accepts Medicaid  °[facility] [address]  °[place] 1-800-659-3381 Substance Abuse/Addiction Treatment  ° °[place] Behavioral Health Resources °Organization         Address  Phone  Notes  °[facility]  (888) 581-9988   °Julie Brannon, PhD [address] [place], [place]   (336) 349-5553 or (336) 951-0000   °[facility]   [address] °[place], [place] (336) 349-4454   °[facility] [address], [place], [place] (336) 342-8316 Insurance/Medicaid/sponsorship through [facility]  °[facility] [address]                                    [place], [place] (336) 342-8316 Therapy/tele-psych/case  °[facility] [address]  ° [place], [place] (336) 349-2233    °Dr. Arfeen  (336) 349-4544   °[facility] [place]  [employer] [place] [facility]  [facility]pt[facility] 1) [address], [place] °2) [address], [place] °3)  [address], [place] (336) 349-3220 °(336) 342-7768 ° °(336) 342-8140   °[place] [facility] (336) 342-1394 or (336) 342-3537 (After Hours)    ° ° ° °

## 2015-10-06 NOTE — ED Notes (Signed)
Discharge instructions/prescription reviewed with patient/family. Understanding verbalized. Patient declined wheelchair at time of discharge. No distress noted.

## 2015-10-06 NOTE — ED Notes (Signed)
Cup of ice water and 2 packs of saltine crackers provided to patient for PO challenge. Patient reports that she feels "much better."

## 2017-12-07 ENCOUNTER — Encounter (HOSPITAL_COMMUNITY): Payer: Self-pay

## 2017-12-07 ENCOUNTER — Emergency Department (HOSPITAL_COMMUNITY)
Admission: EM | Admit: 2017-12-07 | Discharge: 2017-12-07 | Disposition: A | Discharge status: Home / Self Care | Payer: Self-pay | Attending: Emergency Medicine | Admitting: Emergency Medicine

## 2017-12-07 ENCOUNTER — Emergency Department (HOSPITAL_COMMUNITY): Payer: Self-pay

## 2017-12-07 DIAGNOSIS — R55 Syncope and collapse: Secondary | ICD-10-CM | POA: Insufficient documentation

## 2017-12-07 DIAGNOSIS — Z7982 Long term (current) use of aspirin: Secondary | ICD-10-CM | POA: Insufficient documentation

## 2017-12-07 DIAGNOSIS — I10 Essential (primary) hypertension: Secondary | ICD-10-CM | POA: Insufficient documentation

## 2017-12-07 DIAGNOSIS — R51 Headache: Secondary | ICD-10-CM | POA: Insufficient documentation

## 2017-12-07 DIAGNOSIS — R569 Unspecified convulsions: Secondary | ICD-10-CM | POA: Insufficient documentation

## 2017-12-07 DIAGNOSIS — Z8673 Personal history of transient ischemic attack (TIA), and cerebral infarction without residual deficits: Secondary | ICD-10-CM | POA: Insufficient documentation

## 2017-12-07 LAB — CBC WITH DIFFERENTIAL/PLATELET
BASOS PCT: 1 %
Basophils Absolute: 0.1 10*3/uL (ref 0.0–0.1)
EOS ABS: 0 10*3/uL (ref 0.0–0.7)
EOS PCT: 0 %
HCT: 40.7 % (ref 36.0–46.0)
HEMOGLOBIN: 14 g/dL (ref 12.0–15.0)
LYMPHS ABS: 1.6 10*3/uL (ref 0.7–4.0)
Lymphocytes Relative: 17 %
MCH: 30.5 pg (ref 26.0–34.0)
MCHC: 34.4 g/dL (ref 30.0–36.0)
MCV: 88.7 fL (ref 78.0–100.0)
MONO ABS: 0.4 10*3/uL (ref 0.1–1.0)
MONOS PCT: 4 %
Neutro Abs: 7.2 10*3/uL (ref 1.7–7.7)
Neutrophils Relative %: 78 %
PLATELETS: 283 10*3/uL (ref 150–400)
RBC: 4.59 MIL/uL (ref 3.87–5.11)
RDW: 14.9 % (ref 11.5–15.5)
WBC: 9.2 10*3/uL (ref 4.0–10.5)

## 2017-12-07 LAB — COMPREHENSIVE METABOLIC PANEL
ALBUMIN: 4 g/dL (ref 3.5–5.0)
ALK PHOS: 69 U/L (ref 38–126)
ALT: 15 U/L (ref 14–54)
ANION GAP: 12 (ref 5–15)
AST: 25 U/L (ref 15–41)
BUN: 11 mg/dL (ref 6–20)
CALCIUM: 9.3 mg/dL (ref 8.9–10.3)
CO2: 16 mmol/L — AB (ref 22–32)
Chloride: 109 mmol/L (ref 101–111)
Creatinine, Ser: 0.97 mg/dL (ref 0.44–1.00)
GFR calc non Af Amer: >60 mL/min (ref >60)
GLUCOSE: 101 mg/dL — AB (ref 65–99)
POTASSIUM: 4.2 mmol/L (ref 3.5–5.1)
SODIUM: 137 mmol/L (ref 135–145)
TOTAL PROTEIN: 7.2 g/dL (ref 6.5–8.1)
Total Bilirubin: 0.7 mg/dL (ref 0.3–1.2)

## 2017-12-07 LAB — RAPID URINE DRUG SCREEN, HOSP PERFORMED
Amphetamines: NOT DETECTED
BENZODIAZEPINES: NOT DETECTED
Barbiturates: NOT DETECTED
COCAINE: NOT DETECTED
OPIATES: NOT DETECTED
TETRAHYDROCANNABINOL: POSITIVE — AB

## 2017-12-07 LAB — ETHANOL: Alcohol, Ethyl (B): <10 mg/dL (ref <10)

## 2017-12-07 LAB — I-STAT BETA HCG BLOOD, ED (MC, WL, AP ONLY): I-stat hCG, quantitative: <5 m[IU]/mL (ref <5)

## 2017-12-07 MED ORDER — SODIUM CHLORIDE 0.9 % IV BOLUS (SEPSIS)
1000.0000 mL | Freq: Once | INTRAVENOUS | Status: AC
  Administered 2017-12-07: 1000 mL via INTRAVENOUS

## 2017-12-07 NOTE — ED Provider Notes (Signed)
[facility] Provider Note   CSN: [id number] Arrival date & time: 12/07/17  1217     History   Chief Complaint Chief Complaint  Patient presents with  . Seizures    HPI Quiana Myrtis HoppingMcNair is a 47 y.o. female.  HPI Patient with 3 episodes of shaking episodes while at church today.  Helped to the floor by bystanders.  No trauma.  Patient reports frontal headache.  States she was started on Topamax several months ago for seizures.  Denies tongue biting, bowel or bladder incontinence. Past Medical History:  Diagnosis Date  . Daily headache   . Hypercholesteremia   . Hypertension   . TIA (transient ischemic attack) 01/21/2014    Patient Active Problem List   Diagnosis Date Noted  . Dyslipidemia 01/23/2014  . Near syncope 01/21/2014  . TIA (transient ischemic attack) 01/21/2014  . URINALYSIS, ABNORMAL 01/16/2010  . LEG PAIN 12/21/2009  . DYSPEPSIA 10/31/2009  . DIZZINESS 10/31/2009  . IRREGULAR MENSES 12/12/2008  . ESSENTIAL HYPERTENSION 11/01/2008    Past Surgical History:  Procedure Laterality Date  . SHOULDER ARTHROSCOPY W/ ROTATOR CUFF REPAIR Right ~ 2000    OB History    No data available       Home Medications    Prior to Admission medications   Medication Sig Start Date End Date Taking? Authorizing Provider  aspirin EC 81 MG tablet Take 81 mg by mouth daily.   Yes [provider]  hydrochlorothiazide (HYDRODIURIL) 25 MG tablet Take 1 tablet (25 mg total) by mouth daily. 01/24/14  Yes Rai, Ripudeep K, MD  simvastatin (ZOCOR) 20 MG tablet Take 1 tablet (20 mg total) by mouth at bedtime. 01/24/14  Yes Rai, Ripudeep K, MD  topiramate (TOPAMAX) 25 MG tablet Take 25 mg by mouth 2 (two) times daily.   Yes [provider]  ondansetron (ZOFRAN ODT) 8 MG disintegrating tablet 8mg  ODT q4 hours prn nausea Patient not taking: Reported on 12/07/2017 10/06/15   Muthersbaugh, Dahlia ClientHannah, PA-C    Family History History  reviewed. No pertinent family history.  Social History Social History   Tobacco Use  . Smoking status: Never Smoker  . Smokeless tobacco: Never Used  Substance Use Topics  . Alcohol use: Yes    Comment: 01/21/2014 "might have few drinks 6 times/yr, if that"  . Drug use: No     Allergies   Coconut flavor and Cortisone acetate   Review of Systems Review of Systems  Respiratory: Negative for shortness of breath.   Cardiovascular: Negative for chest pain.  Gastrointestinal: Negative for abdominal pain, nausea and vomiting.  Musculoskeletal: Negative for back pain and neck pain.  Neurological: Positive for tremors and headaches.  All other systems reviewed and are negative.    Physical Exam Updated Vital Signs BP (!) 149/86 (BP Location: Right Arm)   Pulse (!) 103   Temp 98.9 F (37.2 C) (Oral)   Resp 18   LMP 11/17/2017   SpO2 99%   Physical Exam  Constitutional: She is oriented to person, place, and time. She appears well-developed and well-nourished. No distress.  HENT:  Head: Normocephalic and atraumatic.  Mouth/Throat: Oropharynx is clear and moist.  No intraoral trauma.  Eyes: EOM are normal. Pupils are equal, round, and reactive to light.  No nystagmus  Neck: Normal range of motion. Neck supple.  No meningismus.  No posterior midline cervical tenderness to palpation.  Cardiovascular: Normal rate and regular rhythm.  Pulmonary/Chest: Effort normal and breath  sounds normal. No stridor. No respiratory distress. She has no wheezes. She has no rales. She exhibits no tenderness.  Abdominal: Soft. Bowel sounds are normal. There is no tenderness. There is no rebound and no guarding.  Musculoskeletal: Normal range of motion. She exhibits no edema or tenderness.  No lower extremity swelling, asymmetry or tenderness.  Neurological: She is alert and oriented to person, place, and time.  Patient is alert and oriented x3 with clear, goal oriented speech. Patient has 5/5  motor in all extremities. Sensation is intact to light touch.  Skin: Skin is warm and dry. Capillary refill takes less than 2 seconds. No rash noted. No erythema.  Psychiatric:  Anxious appearing  Nursing note and vitals reviewed.    ED Treatments / Results  Labs (all labs ordered are listed, but only abnormal results are displayed) Labs Reviewed  COMPREHENSIVE METABOLIC PANEL - Abnormal; Notable for the following components:      Result Value   CO2 16 (*)    Glucose, Bld 101 (*)    All other components within normal limits  RAPID URINE DRUG SCREEN, HOSP PERFORMED - Abnormal; Notable for the following components:   Tetrahydrocannabinol POSITIVE (*)    All other components within normal limits  CBC WITH DIFFERENTIAL/PLATELET  ETHANOL  I-STAT BETA HCG BLOOD, ED ([facility], [facility], [facility] ONLY)    EKG  EKG Interpretation None       Radiology Ct Head Wo Contrast  Result Date: 12/07/2017 CLINICAL DATA:  47 year old female with abrupt with abrupt onset tremor or seizure like activity this morning. Frontal headache. EXAM: CT HEAD WITHOUT CONTRAST TECHNIQUE: Contiguous axial images were obtained from the base of the skull through the vertex without intravenous contrast. COMPARISON:  Brain MRI and intracranial MRA 01/22/2014. Head CT without contrast 01/21/2014. FINDINGS: Brain: Stable and normal cerebral volume. No midline shift, ventriculomegaly, mass effect, evidence of mass lesion, intracranial hemorrhage or evidence of cortically based acute infarction. Gray-white matter differentiation is within normal limits throughout the brain. Vascular: Mild Calcified atherosclerosis at the skull base. No suspicious intracranial vascular hyperdensity. Skull: No acute osseous abnormality identified. Chronic hyperostosis of the calvarium. Sinuses/Orbits: Clear. Other: Visualized orbits and scalp soft tissues are within normal limits. IMPRESSION: Normal head CT, stable since 2015. Electronically Signed   By: Odessa Fleming M.D.   On: 12/07/2017 14:01    Procedures Procedures (including critical care time)  Medications Ordered in ED Medications  sodium chloride 0.9 % bolus 1,000 mL (1,000 mLs Intravenous New Bag/Given 12/07/17 1434)     Initial Impression / Assessment and Plan / ED Course  I have reviewed the triage vital signs and the nursing notes.  Pertinent labs & imaging results that were available during my care of the patient were reviewed by me and considered in my medical decision making (see chart for details).     Patient had brief shaking episode while interviewing the patient.  Responded to voice.  No postictal phase.   She states she is feeling much better.  Question mild dehydration.  Tremor episodes be atypical for seizure-like activity. Final Clinical Impressions(s) / ED Diagnoses   Final diagnoses:  Near syncope    ED Discharge Orders    None       Loren Racer, MD 12/07/17 1526

## 2017-12-07 NOTE — ED Triage Notes (Signed)
To hallway bed via EMS.  Pt in church, at alter, standing, starting having full body tremors x 3 episodes,  was led to floor by bystanders.  EMS witnessed same.  Pt is A&OX4.  C/o frontal headache 10/10.  EMS 160/106 HR 115, SpO2 100%  CBG 117.  Pt reports feeling "tired".  MAE.

## 2021-09-20 ENCOUNTER — Ambulatory Visit: Payer: Self-pay | Admitting: Nurse Practitioner

## 2021-11-13 ENCOUNTER — Other Ambulatory Visit: Payer: Self-pay

## 2021-11-13 ENCOUNTER — Ambulatory Visit (HOSPITAL_COMMUNITY)
Admission: EM | Admit: 2021-11-13 | Discharge: 2021-11-13 | Disposition: A | Discharge status: Home / Self Care | Payer: BC Managed Care – PPO | Attending: Student | Admitting: Student

## 2021-11-13 ENCOUNTER — Encounter (HOSPITAL_COMMUNITY): Payer: Self-pay | Admitting: Emergency Medicine

## 2021-11-13 DIAGNOSIS — J111 Influenza due to unidentified influenza virus with other respiratory manifestations: Secondary | ICD-10-CM | POA: Diagnosis not present

## 2021-11-13 LAB — POC INFLUENZA A AND B ANTIGEN (URGENT CARE ONLY)
INFLUENZA A ANTIGEN, POC: POSITIVE — AB
INFLUENZA B ANTIGEN, POC: NEGATIVE

## 2021-11-13 MED ORDER — ONDANSETRON 8 MG PO TBDP: 8.0000 mg | ORAL_TABLET | Freq: Three times a day (TID) | ORAL | 0 refills | Status: DC | PRN

## 2021-11-13 MED ORDER — OSELTAMIVIR PHOSPHATE 75 MG PO CAPS: 75.0000 mg | ORAL_CAPSULE | Freq: Two times a day (BID) | ORAL | 0 refills | Status: DC

## 2021-11-13 NOTE — ED Triage Notes (Signed)
Pt presents with body aches and chills that started earlier today. States took at home COVID test with negative result.

## 2021-11-13 NOTE — ED Provider Notes (Signed)
[facility]    CSN: [id number] Arrival date & time: 11/13/21  1710      History   Chief Complaint Chief Complaint  Patient presents with   Generalized Body Aches    HPI Sherry Nicholson is a 50 y.o. female presenting with influenza-like illness x13 hours following exposure to this. Medical history noncontributory. Feels like getting hit by a train Fatigue, malaise  Generalized myalgias Temperature normal range at home - subjective chills Denies n/v/d/c/abd pain, decreased appetite but tolerating fluids. Negative home covid test, exposure to influenza  Tried theraflu 12 hours ago without improvement. Denies history pulm ds.  Has not had flu shot    HPI  Past Medical History:  Diagnosis Date   Daily headache    Hypercholesteremia    Hypertension    TIA (transient ischemic attack) 01/21/2014    Patient Active Problem List   Diagnosis Date Noted   Dyslipidemia 01/23/2014   Near syncope 01/21/2014   TIA (transient ischemic attack) 01/21/2014   URINALYSIS, ABNORMAL 01/16/2010   LEG PAIN 12/21/2009   DYSPEPSIA 10/31/2009   DIZZINESS 10/31/2009   IRREGULAR MENSES 12/12/2008   ESSENTIAL HYPERTENSION 11/01/2008    Past Surgical History:  Procedure Laterality Date   SHOULDER ARTHROSCOPY W/ ROTATOR CUFF REPAIR Right ~ 2000    OB History   No obstetric history on file.      Home Medications    Prior to Admission medications   Medication Sig Start Date End Date Taking? Authorizing Provider  ondansetron (ZOFRAN-ODT) 8 MG disintegrating tablet Take 1 tablet (8 mg total) by mouth every 8 (eight) hours as needed for nausea or vomiting. 11/13/21  Yes Hazel Sams, PA-C  oseltamivir (TAMIFLU) 75 MG capsule Take 1 capsule (75 mg total) by mouth every 12 (twelve) hours. 11/13/21  Yes Hazel Sams, PA-C  aspirin EC 81 MG tablet Take 81 mg by mouth daily.    [provider]  hydrochlorothiazide (HYDRODIURIL) 25 MG tablet Take 1 tablet (25 mg  total) by mouth daily. 01/24/14   Rai, Vernelle Emerald, MD  simvastatin (ZOCOR) 20 MG tablet Take 1 tablet (20 mg total) by mouth at bedtime. 01/24/14   Rai, Vernelle Emerald, MD  topiramate (TOPAMAX) 25 MG tablet Take 25 mg by mouth 2 (two) times daily.    [provider]    Family History History reviewed. No pertinent family history.  Social History Social History   Tobacco Use   Smoking status: Never   Smokeless tobacco: Never  Substance Use Topics   Alcohol use: Yes    Comment: 01/21/2014 "might have few drinks 6 times/yr, if that"   Drug use: No     Allergies   Coconut flavor and Cortisone acetate   Review of Systems Review of Systems  Constitutional:  Negative for appetite change, chills and fever.  HENT:  Positive for congestion. Negative for ear pain, rhinorrhea, sinus pressure, sinus pain and sore throat.   Eyes:  Negative for redness and visual disturbance.  Respiratory:  Positive for cough. Negative for chest tightness, shortness of breath and wheezing.   Cardiovascular:  Negative for chest pain and palpitations.  Gastrointestinal:  Negative for abdominal pain, constipation, diarrhea, nausea and vomiting.  Genitourinary:  Negative for dysuria, frequency and urgency.  Musculoskeletal:  Positive for myalgias.  Neurological:  Negative for dizziness, weakness and headaches.  Psychiatric/Behavioral:  Negative for confusion.   All other systems reviewed and are negative.   Physical Exam Triage Vital Signs ED Triage  Vitals  Enc Vitals Group     BP 11/13/21 1808 (!) 138/93     Pulse Rate 11/13/21 1808 (!) 104     Resp 11/13/21 1808 16     Temp 11/13/21 1808 98.4 F (36.9 C)     Temp Source 11/13/21 1808 Oral     SpO2 11/13/21 1808 99 %     Weight --      Height --      Head Circumference --      Peak Flow --      Pain Score 11/13/21 1807 10     Pain Loc --      Pain Edu? --      Excl. in GC? --    No data found.  Updated Vital Signs BP (!) 138/93 (BP  Location: Right Arm)    Pulse (!) 104    Temp 98.4 F (36.9 C) (Oral)    Resp 16    LMP 10/18/2021    SpO2 99%   Visual Acuity Right Eye Distance:   Left Eye Distance:   Bilateral Distance:    Right Eye Near:   Left Eye Near:    Bilateral Near:     Physical Exam Vitals reviewed.  Constitutional:      General: She is not in acute distress.    Appearance: Normal appearance. She is ill-appearing.  HENT:     Head: Normocephalic and atraumatic.     Right Ear: Tympanic membrane, ear canal and external ear normal. No tenderness. No middle ear effusion. There is no impacted cerumen. Tympanic membrane is not perforated, erythematous, retracted or bulging.     Left Ear: Tympanic membrane, ear canal and external ear normal. No tenderness.  No middle ear effusion. There is no impacted cerumen. Tympanic membrane is not perforated, erythematous, retracted or bulging.     Nose: Nose normal. No congestion.     Mouth/Throat:     Mouth: Mucous membranes are moist.     Pharynx: Uvula midline. No oropharyngeal exudate or posterior oropharyngeal erythema.  Eyes:     Extraocular Movements: Extraocular movements intact.     Pupils: Pupils are equal, round, and reactive to light.  Cardiovascular:     Rate and Rhythm: Regular rhythm. Tachycardia present.     Heart sounds: Normal heart sounds.  Pulmonary:     Effort: Pulmonary effort is normal.     Breath sounds: Normal breath sounds. No decreased breath sounds, wheezing, rhonchi or rales.  Abdominal:     Palpations: Abdomen is soft.     Tenderness: There is no abdominal tenderness. There is no guarding or rebound.  Lymphadenopathy:     Cervical: No cervical adenopathy.     Right cervical: No superficial cervical adenopathy.    Left cervical: No superficial cervical adenopathy.  Neurological:     General: No focal deficit present.     Mental Status: She is alert and oriented to person, place, and time.  Psychiatric:        Mood and Affect: Mood  normal.        Behavior: Behavior normal.        Thought Content: Thought content normal.        Judgment: Judgment normal.     UC Treatments / Results  Labs (all labs ordered are listed, but only abnormal results are displayed) Labs Reviewed  POC INFLUENZA A AND B ANTIGEN (URGENT CARE ONLY)    EKG   Radiology No results found.  Procedures Procedures (  including critical care time)  Medications Ordered in UC Medications - No data to display  Initial Impression / Assessment and Plan / UC Course  I have reviewed the triage vital signs and the nursing notes.  Pertinent labs & imaging results that were available during my care of the patient were reviewed by me and considered in my medical decision making (see chart for details).     This patient is a very pleasant 49 y.o. year old female presenting with influneza A following exposure to this. Tachy but afebrile. Has not taken antipyretic. Denies history pulm ds. States she is not pregnant or breastfeeding. LMP 3 weeks ago.  Rapid influenza A positive. Tamiflu, zofran ODT sent . Continue OTC medications for additional relief.  ED return precautions discussed. Patient verbalizes understanding and agreement.   Coding Level 4 for acute illness with systemic symptoms, and prescription drug management   Final Clinical Impressions(s) / UC Diagnoses   Final diagnoses:  Influenza with respiratory manifestation     Discharge Instructions      -Tamiflu twice daily x5 days. This medication can cause nausea, so I also sent nausea medication. You can stop the Tamiflu if you don't like it or if it causes side effects.  -Take the Zofran (ondansetron) up to 3 times daily for nausea and vomiting. -For fevers/chills, bodyaches, headaches- You can take Tylenol up to 1000 mg 3 times daily, and ibuprofen up to 600 mg 3 times daily with food.  You can take these together, or alternate every 3-4 hours. -Drink plenty of water/gatorade and  get plenty of rest -With a virus, you're typically contagious for 5-7 days, or as long as you're having fevers.  -Come back and see [place] if things are getting worse instead of better, like shortness of breath, chest pain, fevers and chills that are getting higher instead of lower and do not come down with Tylenol or ibuprofen, etc.      ED Prescriptions     Medication Sig Dispense Auth. Provider   oseltamivir (TAMIFLU) 75 MG capsule Take 1 capsule (75 mg total) by mouth every 12 (twelve) hours. 10 capsule Marin Roberts E, PA-C   ondansetron (ZOFRAN-ODT) 8 MG disintegrating tablet Take 1 tablet (8 mg total) by mouth every 8 (eight) hours as needed for nausea or vomiting. 20 tablet Hazel Sams, PA-C      PDMP not reviewed this encounter.   Hazel Sams, PA-C 11/13/21 1842

## 2021-11-13 NOTE — Discharge Instructions (Addendum)
-  Tamiflu twice daily x5 days. This medication can cause nausea, so I also sent nausea medication. You can stop the Tamiflu if you don't like it or if it causes side effects.  -Take the Zofran (ondansetron) up to 3 times daily for nausea and vomiting. -For fevers/chills, bodyaches, headaches- You can take Tylenol up to 1000 mg 3 times daily, and ibuprofen up to 600 mg 3 times daily with food.  You can take these together, or alternate every 3-4 hours. -Drink plenty of water/gatorade and get plenty of rest -With a virus, you're typically contagious for 5-7 days, or as long as you're having fevers.  -Come back and see us if things are getting worse instead of better, like shortness of breath, chest pain, fevers and chills that are getting higher instead of lower and do not come down with Tylenol or ibuprofen, etc.  

## 2023-05-16 ENCOUNTER — Encounter: Payer: Self-pay | Admitting: Podiatry

## 2023-05-16 ENCOUNTER — Ambulatory Visit (INDEPENDENT_AMBULATORY_CARE_PROVIDER_SITE_OTHER): Payer: 59 | Admitting: Podiatry

## 2023-05-16 ENCOUNTER — Ambulatory Visit (INDEPENDENT_AMBULATORY_CARE_PROVIDER_SITE_OTHER): Payer: 59

## 2023-05-16 DIAGNOSIS — M7751 Other enthesopathy of right foot: Secondary | ICD-10-CM

## 2023-05-16 DIAGNOSIS — M25571 Pain in right ankle and joints of right foot: Secondary | ICD-10-CM

## 2023-05-16 MED ORDER — DICLOFENAC SODIUM 75 MG PO TBEC: 75.0000 mg | DELAYED_RELEASE_TABLET | Freq: Two times a day (BID) | ORAL | 2 refills | Status: DC

## 2023-05-16 MED ORDER — TRIAMCINOLONE ACETONIDE 10 MG/ML IJ SUSP: 10.0000 mg | Freq: Once | INTRAMUSCULAR | Status: AC

## 2023-05-16 NOTE — Progress Notes (Signed)
Subjective:   Patient ID: Sherry Nicholson, female   DOB: 52 y.o.   MRN: [id number]   HPI Patient presents stating that she has had chronic pain in her ankle of 2 years duration after having had an injury and wearing a boot with the tentative diagnosis of a fracture.  States that she is going to [employer] she needs to do a lot of walking next week.  Patient does not smoke likes to be active   Review of Systems  All other systems reviewed and are negative.       Objective:  Physical Exam Vitals and nursing note reviewed.  Constitutional:      Appearance: She is well-developed.  Pulmonary:     Effort: Pulmonary effort is normal.  Musculoskeletal:        General: Normal range of motion.  Skin:    General: Skin is warm.  Neurological:     Mental Status: She is alert.     Neurovascular status intact muscle strength found to be adequate range of motion adequate with patient found to have intense discomfort in the subtalar joint right and reduced range of motion noted of the ankle joint right over left.  Patient has good digital perfusion well-oriented     Assessment:  Probability for inflammatory condition cannot rule out tendon ligament or bony injury associated with previous condition     Plan:  H&P reviewed went ahead today did sterile prep injected the sinus tarsi 3 mg dexamethasone Kenalog 5 mg Xylocaine and I want to see her back again in a couple weeks after she returns from her trip.  If symptoms persist will need to consider MRI to better understand the capsule of the ankle joint and whether or not there is any ligament damage or bone spur formation  X-rays do indicate the joint looks relatively healthy with no indication currently of diastases injury

## 2023-05-30 ENCOUNTER — Encounter: Payer: Self-pay | Admitting: Podiatry

## 2023-05-30 ENCOUNTER — Ambulatory Visit (INDEPENDENT_AMBULATORY_CARE_PROVIDER_SITE_OTHER): Payer: 59 | Admitting: Podiatry

## 2023-05-30 DIAGNOSIS — M7751 Other enthesopathy of right foot: Secondary | ICD-10-CM | POA: Diagnosis not present

## 2023-05-30 MED ORDER — TRIAMCINOLONE ACETONIDE 10 MG/ML IJ SUSP
10.0000 mg | Freq: Once | INTRAMUSCULAR | Status: AC
  Administered 2023-05-30: 10 mg

## 2023-06-01 NOTE — Progress Notes (Signed)
Subjective:   Patient ID: Sherry Nicholson, female   DOB: 52 y.o.   MRN: [id number]   HPI Patient states she is doing pretty well but just has a somewhat new area of pain that is bothersome for her   ROS      Objective:  Physical Exam  Neurovascular status intact with inflammation within the sinus tarsi right fluid buildup moderate pain     Assessment:  Capsulitis right inflammation noted     Plan:  Reviewed with the patient went ahead today and did sterile prep and reinjected the capsule 3 mg Kenalog 5 mg Xylocaine

## 2023-08-29 ENCOUNTER — Ambulatory Visit (INDEPENDENT_AMBULATORY_CARE_PROVIDER_SITE_OTHER): Payer: No Typology Code available for payment source | Admitting: Family Medicine

## 2023-08-29 ENCOUNTER — Encounter: Payer: Self-pay | Admitting: Family Medicine

## 2023-08-29 VITALS — BP 146/85 | HR 71 | Temp 98.8°F | Ht 65.0 in | Wt 187.0 lb

## 2023-08-29 DIAGNOSIS — F4321 Adjustment disorder with depressed mood: Secondary | ICD-10-CM

## 2023-08-29 DIAGNOSIS — I10 Essential (primary) hypertension: Secondary | ICD-10-CM | POA: Diagnosis not present

## 2023-08-29 DIAGNOSIS — G43009 Migraine without aura, not intractable, without status migrainosus: Secondary | ICD-10-CM

## 2023-08-29 DIAGNOSIS — Z8673 Personal history of transient ischemic attack (TIA), and cerebral infarction without residual deficits: Secondary | ICD-10-CM

## 2023-08-29 DIAGNOSIS — Z87898 Personal history of other specified conditions: Secondary | ICD-10-CM

## 2023-08-29 DIAGNOSIS — Z7689 Persons encountering health services in other specified circumstances: Secondary | ICD-10-CM

## 2023-08-29 NOTE — Progress Notes (Signed)
Established Patient Office Visit   Subjective  Patient ID: Sherry Nicholson, female    DOB: 1971/07/28  Age: 52 y.o. MRN: [id number]  Chief Complaint  Patient presents with   New Patient (Initial Visit)    Patient is a 52 year old female previously seen in [place], [place] who presents for establish care and follow-up on ongoing concerns.  Patient states she is no longer on any medications and she took herself off.  History of TIA: Occurred in 2010 or 2013.  No longer taking aspirin 81 mg daily.  History of seizures: Occurred in 2017 or 2018 after her grandfather's death.  Thought due to stress.  Was previously on topiramate.  Migraines: Had one 2 weeks ago.  States pain was in temples and forehead.  Typically has blurred vision and sensitivity to light.  Unable to eat when has headache.  Denies nausea/vomiting.  Will sleep to get headache to improve.  Does not take any meds.  Does not recall being prescribed migraine medication.  HTN: Previously on HCTZ 25 mg.  Not currently taking medication.  Has not been checking BP at home.  Eating fast food as does not like to cook for herself.  Social history: Patient previously lived in [place] for work.  Back in for several years.  Endorses dealing with the death of her mom in February 03, 2023.  Does not go out much and stays to herself.  Patient states she experienced increased stress starting in September 2023 with finding out about her mother's prognosis, ending a long-term relationship, and getting a promotion at work.  Patient was a social cigarette smoker but recently stopped.  Family medical history: Mom-deceased at age 16.  Uterine and ovarian cancer with mets to lungs.  HTN, HLD Dad-alive, HTN, HLD Sister-HTN, migraines MGF-deceased at age 48, CHF, dementia Maternal aunt-breast cancer, CHF Maternal aunt-CHF    Patient Active Problem List   Diagnosis Date Noted   Screening for STD (sexually transmitted disease) 09/07/2014   Appetite  impaired 09/07/2014   Left arm pain 09/07/2014   Left arm weakness 09/07/2014   Rapid palpitations 03/30/2014   Dyslipidemia 01/23/2014   HTN (hypertension) 01/21/2014   TIA (transient ischemic attack) 01/21/2014   URINALYSIS, ABNORMAL 01/16/2010   LEG PAIN 12/21/2009   DYSPEPSIA 10/31/2009   DIZZINESS 10/31/2009   IRREGULAR MENSES 12/12/2008   Essential hypertension 11/01/2008   Past Medical History:  Diagnosis Date   Daily headache    Hypercholesteremia    Hypertension    TIA (transient ischemic attack) 01/21/2014   Past Surgical History:  Procedure Laterality Date   SHOULDER ARTHROSCOPY W/ ROTATOR CUFF REPAIR Right ~ 2000   Social History   Tobacco Use   Smoking status: Never   Smokeless tobacco: Never  Substance Use Topics   Alcohol use: Yes    Comment: 01/21/2014 "might have few drinks 6 times/yr, if that"   Drug use: No   No family history on file. Allergies  Allergen Reactions   Octacosanol Swelling   Coconut Flavor Itching   Cortisone Acetate Itching and Rash      ROS Negative unless stated above    Objective:     BP (!) 146/85 (BP Location: Left Arm, Patient Position: Sitting)   Pulse 71   Temp 98.8 F (37.1 C) (Oral)   Ht 5\' 5"  (1.651 m)   Wt 187 lb (84.8 kg)   LMP 08/23/2023   SpO2 99%   BMI 31.12 kg/m  BP Readings from Last 3 Encounters:  08/29/23 (!) 146/85  11/13/21 (!) 138/93  12/07/17 134/77   Wt Readings from Last 3 Encounters:  08/29/23 187 lb (84.8 kg)  06/16/14 164 lb (74.4 kg)  01/21/14 170 lb (77.1 kg)      Physical Exam Constitutional:      General: She is not in acute distress.    Appearance: Normal appearance.  HENT:     Head: Normocephalic and atraumatic.     Nose: Nose normal.     Mouth/Throat:     Mouth: Mucous membranes are moist.  Eyes:     Extraocular Movements: Extraocular movements intact.     Conjunctiva/sclera: Conjunctivae normal.  Cardiovascular:     Rate and Rhythm: Normal rate and regular rhythm.      Heart sounds: Normal heart sounds. No murmur heard.    No gallop.  Pulmonary:     Effort: Pulmonary effort is normal. No respiratory distress.     Breath sounds: Normal breath sounds. No wheezing, rhonchi or rales.  Musculoskeletal:        General: Normal range of motion.  Skin:    General: Skin is warm and dry.  Neurological:     Mental Status: She is alert and oriented to person, place, and time.       08/29/2023    9:51 AM  Depression screen PHQ 2/9  Decreased Interest 3  Down, Depressed, Hopeless 3  PHQ - 2 Score 6  Altered sleeping 3  Tired, decreased energy 3  Change in appetite 3  Feeling bad or failure about yourself  3  Trouble concentrating 3  Moving slowly or fidgety/restless 0  Suicidal thoughts 0  PHQ-9 Score 21      08/29/2023    9:51 AM  GAD 7 : Generalized Anxiety Score  Nervous, Anxious, on Edge 3  Control/stop worrying 3  Worry too much - different things 3  Trouble relaxing 2  Restless 0  Easily annoyed or irritable 3  Afraid - awful might happen 3  Total GAD 7 Score 17  Anxiety Difficulty Somewhat difficult      No results found for any visits on 08/29/23.    Assessment & Plan:  Essential hypertension -Elevated at 148/92 -Recheck remains elevated at 146/85 -Lifestyle modifications encouraged -Patient encouraged to start checking BP at home and keeping a log to bring with her to next appointment.  For continued elevation consistently greater than 140/90 restart medication.  Was previously on HCTZ 25 mg daily.  Migraine without aura and without status migrainosus, not intractable -Likely 2/2 increased stress -Discussed migraine prevention -Consider taking Tylenol at start of headache. -For continued/increased migraine symptoms consider a triptan.  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 -Consider medication options  History of TIA  (transient ischemic attack) -Discussed the importance of prevention including diet changes, exercise, BP control. -Will obtain labs at upcoming CPE  History of seizure -Not currently on medication -Continue to monitor  Encounter to establish care  -We reviewed the PMH, PSH, FH, SH, Meds and Allergies. -We provided refills for any medications we will prescribe as needed. -We addressed current concerns per orders and patient instructions. -We have asked for records for pertinent exams, studies, vaccines and notes from previous providers. -We have advised patient to follow up per instructions below.   Return for physical.   Deeann Saint, MD

## 2023-08-29 NOTE — Patient Instructions (Addendum)
It was nice meeting you today.  You can schedule a physical at your convenience.  Behavioral Health Services: -to make an appointment contact the office/provider you are interested in seeing.  No referral is needed.  The below is not an all inclusive list, but will help you get started.  [email or web address] -counseling located off of [address]  [email or web address] -website helps you find providers in your area  [facility] -Located off of [address]. across from [place]  Dr. Jannifer Franklin is a Therapist, sports with [facility]. [phone]  [facility] Counseling and wellness  [facility]  -[address]  [phone] -a place in town that has counseling and Psychiatry services.

## 2023-09-03 ENCOUNTER — Encounter: Payer: Self-pay | Admitting: Family Medicine

## 2023-09-03 ENCOUNTER — Other Ambulatory Visit (HOSPITAL_COMMUNITY)
Admission: RE | Admit: 2023-09-03 | Discharge: 2023-09-03 | Disposition: A | Discharge status: Home / Self Care | Payer: No Typology Code available for payment source | Source: Ambulatory Visit | Attending: Family Medicine | Admitting: Family Medicine

## 2023-09-03 ENCOUNTER — Ambulatory Visit (INDEPENDENT_AMBULATORY_CARE_PROVIDER_SITE_OTHER): Payer: No Typology Code available for payment source | Admitting: Family Medicine

## 2023-09-03 VITALS — BP 130/86 | HR 92 | Temp 98.5°F | Ht 65.0 in | Wt 186.6 lb

## 2023-09-03 DIAGNOSIS — Z0000 Encounter for general adult medical examination without abnormal findings: Secondary | ICD-10-CM

## 2023-09-03 DIAGNOSIS — Z124 Encounter for screening for malignant neoplasm of cervix: Secondary | ICD-10-CM | POA: Insufficient documentation

## 2023-09-03 DIAGNOSIS — Z1231 Encounter for screening mammogram for malignant neoplasm of breast: Secondary | ICD-10-CM

## 2023-09-03 DIAGNOSIS — I10 Essential (primary) hypertension: Secondary | ICD-10-CM

## 2023-09-03 DIAGNOSIS — Z113 Encounter for screening for infections with a predominantly sexual mode of transmission: Secondary | ICD-10-CM

## 2023-09-03 DIAGNOSIS — Z1159 Encounter for screening for other viral diseases: Secondary | ICD-10-CM

## 2023-09-03 DIAGNOSIS — Z1211 Encounter for screening for malignant neoplasm of colon: Secondary | ICD-10-CM

## 2023-09-03 LAB — CBC WITH DIFFERENTIAL/PLATELET
Basophils Absolute: 0.1 10*3/uL (ref 0.0–0.1)
Basophils Relative: 1.3 % (ref 0.0–3.0)
Eosinophils Absolute: 0.1 10*3/uL (ref 0.0–0.7)
Eosinophils Relative: 2.1 % (ref 0.0–5.0)
HCT: 41 % (ref 36.0–46.0)
Hemoglobin: 13.3 g/dL (ref 12.0–15.0)
Lymphocytes Relative: 35.6 % (ref 12.0–46.0)
Lymphs Abs: 2.2 10*3/uL (ref 0.7–4.0)
MCHC: 32.5 g/dL (ref 30.0–36.0)
MCV: 85 fL (ref 78.0–100.0)
Monocytes Absolute: 0.4 10*3/uL (ref 0.1–1.0)
Monocytes Relative: 6.7 % (ref 3.0–12.0)
Neutro Abs: 3.4 10*3/uL (ref 1.4–7.7)
Neutrophils Relative %: 54.3 % (ref 43.0–77.0)
Platelets: 326 10*3/uL (ref 150.0–400.0)
RBC: 4.82 Mil/uL (ref 3.87–5.11)
RDW: 15.8 % — ABNORMAL HIGH (ref 11.5–15.5)
WBC: 6.3 10*3/uL (ref 4.0–10.5)

## 2023-09-03 LAB — COMPREHENSIVE METABOLIC PANEL
ALT: 8 U/L (ref 0–35)
AST: 16 U/L (ref 0–37)
Albumin: 4.3 g/dL (ref 3.5–5.2)
Alkaline Phosphatase: 81 U/L (ref 39–117)
BUN: 9 mg/dL (ref 6–23)
CO2: 26 meq/L (ref 19–32)
Calcium: 9.8 mg/dL (ref 8.4–10.5)
Chloride: 102 meq/L (ref 96–112)
Creatinine, Ser: 0.76 mg/dL (ref 0.40–1.20)
GFR: 90.87 mL/min (ref >60.00)
Glucose, Bld: 79 mg/dL (ref 70–99)
Potassium: 3.9 meq/L (ref 3.5–5.1)
Sodium: 136 meq/L (ref 135–145)
Total Bilirubin: 0.5 mg/dL (ref 0.2–1.2)
Total Protein: 7.8 g/dL (ref 6.0–8.3)

## 2023-09-03 LAB — LIPID PANEL
Cholesterol: 212 mg/dL — ABNORMAL HIGH (ref 0–200)
HDL: 65.8 mg/dL (ref >39.00)
LDL Cholesterol: 135 mg/dL — ABNORMAL HIGH (ref 0–99)
NonHDL: 146.12
Total CHOL/HDL Ratio: 3
Triglycerides: 58 mg/dL (ref 0.0–149.0)
VLDL: 11.6 mg/dL (ref 0.0–40.0)

## 2023-09-03 LAB — HEMOGLOBIN A1C: Hgb A1c MFr Bld: 5.6 % (ref 4.6–6.5)

## 2023-09-03 LAB — T4, FREE: Free T4: 0.63 ng/dL (ref 0.60–1.60)

## 2023-09-03 LAB — TSH: TSH: 0.68 u[IU]/mL (ref 0.35–5.50)

## 2023-09-03 NOTE — Progress Notes (Signed)
Established Patient Office Visit   Subjective  Patient ID: Sebrina Kessner, female    DOB: 17-Nov-1971  Age: 52 y.o. MRN: [id number]  Chief Complaint  Patient presents with   Annual Exam    Patient is a 52 year old female seen for CPE.  Patient states she has been doing well overall.  No current issues.  BP improved since last visit.  Previously on HCTZ 25 mg daily.  Not currently on medication.    Patient Active Problem List   Diagnosis Date Noted   Screening for STD (sexually transmitted disease) 09/07/2014   Appetite impaired 09/07/2014   Left arm pain 09/07/2014   Left arm weakness 09/07/2014   Rapid palpitations 03/30/2014   Dyslipidemia 01/23/2014   HTN (hypertension) 01/21/2014   TIA (transient ischemic attack) 01/21/2014   URINALYSIS, ABNORMAL 01/16/2010   Headache 12/21/2009   DYSPEPSIA 10/31/2009   DIZZINESS 10/31/2009   IRREGULAR MENSES 12/12/2008   Essential hypertension 11/01/2008   Past Medical History:  Diagnosis Date   Daily headache    Hypercholesteremia    Hypertension    TIA (transient ischemic attack) 01/21/2014   Past Surgical History:  Procedure Laterality Date   SHOULDER ARTHROSCOPY W/ ROTATOR CUFF REPAIR Right ~ 2000   Social History   Tobacco Use   Smoking status: Never   Smokeless tobacco: Never  Substance Use Topics   Alcohol use: Yes    Comment: 01/21/2014 "might have few drinks 6 times/yr, if that"   Drug use: No   History reviewed. No pertinent family history. Allergies  Allergen Reactions   Octacosanol Swelling   Coconut Flavor Itching   Cortisone Acetate Itching and Rash      ROS Negative unless stated above    Objective:     BP 130/86 (BP Location: Left Arm, Patient Position: Sitting, Cuff Size: Normal)   Pulse 92   Temp 98.5 F (36.9 C) (Oral)   Ht 5\' 5"  (1.651 m)   Wt 186 lb 9.6 oz (84.6 kg)   LMP 08/23/2023   SpO2 99%   BMI 31.05 kg/m  BP Readings from Last 3 Encounters:  09/03/23 130/86  08/29/23 (!)  146/85  11/13/21 (!) 138/93   Wt Readings from Last 3 Encounters:  09/03/23 186 lb 9.6 oz (84.6 kg)  08/29/23 187 lb (84.8 kg)  06/16/14 164 lb (74.4 kg)      Physical Exam Constitutional:      Appearance: Normal appearance.  HENT:     Head: Normocephalic and atraumatic.     Right Ear: Tympanic membrane, ear canal and external ear normal.     Left Ear: Tympanic membrane, ear canal and external ear normal.     Nose: Nose normal.     Mouth/Throat:     Mouth: Mucous membranes are moist.     Pharynx: No oropharyngeal exudate or posterior oropharyngeal erythema.  Eyes:     General: No scleral icterus.    Extraocular Movements: Extraocular movements intact.     Conjunctiva/sclera: Conjunctivae normal.     Pupils: Pupils are equal, round, and reactive to light.  Neck:     Thyroid: No thyromegaly.  Cardiovascular:     Rate and Rhythm: Normal rate and regular rhythm.     Pulses: Normal pulses.     Heart sounds: Normal heart sounds. No murmur heard.    No friction rub.  Pulmonary:     Effort: Pulmonary effort is normal.     Breath sounds: Normal breath sounds. No wheezing, rhonchi or  rales.  Abdominal:     General: Bowel sounds are normal.     Palpations: Abdomen is soft.     Tenderness: There is no abdominal tenderness.  Genitourinary:    General: Normal vulva.     Labia:        Right: No rash.        Left: No rash.      Vagina: No tenderness or bleeding.     Cervix: Cervical bleeding present.     Uterus: Normal.      Adnexa: Left adnexa normal.     Rectum: Normal.  Musculoskeletal:        General: No deformity. Normal range of motion.  Lymphadenopathy:     Cervical: No cervical adenopathy.  Skin:    General: Skin is warm and dry.     Findings: No lesion.  Neurological:     General: No focal deficit present.     Mental Status: She is alert and oriented to person, place, and time.  Psychiatric:        Mood and Affect: Mood normal.        Thought Content: Thought  content normal.     No results found for any visits on 09/03/23.    Assessment & Plan:  Encounter for preventive health examination -     Comprehensive metabolic panel -     CBC with Differential/Platelet -     TSH -     T4, free -     Hemoglobin A1c -     Lipid panel  Screen for colon cancer -     Ambulatory referral to Gastroenterology  Essential hypertension -     Comprehensive metabolic panel -     CBC with Differential/Platelet -     TSH -     T4, free -     Lipid panel  Encounter for screening mammogram for malignant neoplasm of breast -     3D Screening Mammogram, Left and Right; Future  Encounter for hepatitis C screening test for low risk patient -     Hepatitis C antibody  Screening for cervical cancer -     Cytology - PAP  Routine screening for STI (sexually transmitted infection) -     RPR -     HIV Antibody (routine testing w rflx)  Age-appropriate health screenings discussed.  Obtain labs.  Immunizations reviewed.  Consider influenza vaccine and Tdap.  Pap obtained this visit.  Mammogram and colonoscopy ordered.  BP controlled this visit.  Continue to monitor.  For elevations consistently greater than 140/90 restart medication.   No follow-ups on file.   Deeann Saint, MD

## 2023-09-04 LAB — CYTOLOGY - PAP
Chlamydia: NEGATIVE
Comment: NEGATIVE
Comment: NEGATIVE
Comment: NEGATIVE
Comment: NORMAL
Diagnosis: NEGATIVE
High risk HPV: NEGATIVE
Neisseria Gonorrhea: NEGATIVE
Trichomonas: NEGATIVE

## 2023-09-05 ENCOUNTER — Other Ambulatory Visit: Payer: Self-pay | Admitting: Family Medicine

## 2023-09-05 DIAGNOSIS — B379 Candidiasis, unspecified: Secondary | ICD-10-CM

## 2023-09-05 LAB — HEPATITIS C ANTIBODY: Hepatitis C Ab: NONREACTIVE

## 2023-09-05 LAB — RPR: RPR Ser Ql: NONREACTIVE

## 2023-09-05 LAB — HIV ANTIBODY (ROUTINE TESTING W REFLEX): HIV 1&2 Ab, 4th Generation: NONREACTIVE

## 2023-09-05 MED ORDER — FLUCONAZOLE 150 MG PO TABS
150.0000 mg | ORAL_TABLET | Freq: Once | ORAL | 0 refills | Status: AC
Start: 2023-09-05 — End: 2023-09-05

## 2023-10-13 ENCOUNTER — Ambulatory Visit: Payer: No Typology Code available for payment source

## 2023-11-18 ENCOUNTER — Ambulatory Visit: Payer: No Typology Code available for payment source

## 2023-12-15 ENCOUNTER — Ambulatory Visit (AMBULATORY_SURGERY_CENTER): Payer: No Typology Code available for payment source

## 2023-12-15 ENCOUNTER — Other Ambulatory Visit: Payer: Self-pay

## 2023-12-15 VITALS — Ht 65.0 in | Wt 175.0 lb

## 2023-12-15 DIAGNOSIS — Z1211 Encounter for screening for malignant neoplasm of colon: Secondary | ICD-10-CM

## 2023-12-15 NOTE — Progress Notes (Signed)
Denies allergies to eggs or soy products. Denies complication of anesthesia or sedation. Denies use of weight loss medication. Denies use of O2.   Emmi instructions given for colonoscopy.    This patient is a direct colonoscopy screening. During Pre-Visit the patient states that she had a seizure on January 14-2025. Patient states that she actually had 14 or 15 seizures in one day. She was out of town and seen in the emergency room where she was placed on medication. She was instructed to follow up with a Neurologist in [place]. I instructed the patient to call her primary care physician to get a referral so that the patient could be seen sooner.  Patient agreed to do this. Patient was instructed to call back after she has seen her neurologist and reschedule colonoscopy when she has been 3 months without a seizure per [facility] guidelines. Patient verbalizes understanding. Procedure will be cancelled on her behalf.

## 2023-12-17 ENCOUNTER — Encounter: Payer: Self-pay | Admitting: Neurology

## 2024-01-12 ENCOUNTER — Encounter: Payer: No Typology Code available for payment source | Admitting: Pediatrics

## 2024-01-16 ENCOUNTER — Ambulatory Visit: Payer: No Typology Code available for payment source | Admitting: Neurology

## 2024-01-22 ENCOUNTER — Ambulatory Visit: Payer: No Typology Code available for payment source | Admitting: Neurology

## 2024-02-10 ENCOUNTER — Telehealth: Payer: Self-pay | Admitting: Family Medicine

## 2024-02-10 ENCOUNTER — Ambulatory Visit (INDEPENDENT_AMBULATORY_CARE_PROVIDER_SITE_OTHER): Admitting: Neurology

## 2024-02-10 ENCOUNTER — Encounter: Payer: Self-pay | Admitting: Neurology

## 2024-02-10 VITALS — BP 147/91 | HR 80 | Ht 65.0 in | Wt 193.0 lb

## 2024-02-10 DIAGNOSIS — G40909 Epilepsy, unspecified, not intractable, without status epilepticus: Secondary | ICD-10-CM

## 2024-02-10 DIAGNOSIS — G43009 Migraine without aura, not intractable, without status migrainosus: Secondary | ICD-10-CM | POA: Diagnosis not present

## 2024-02-10 MED ORDER — TOPIRAMATE 50 MG PO TABS: 50.0000 mg | ORAL_TABLET | Freq: Two times a day (BID) | ORAL | 3 refills | Status: AC

## 2024-02-10 NOTE — Progress Notes (Signed)
 NEUROLOGY CONSULTATION NOTE  Brittny Spangle MRN: [id number] DOB: 1971-07-13  Referring provider: Dr. Abbe Amsterdam Primary care provider: Dr. Abbe Amsterdam  Reason for consult:  seizure, headache  Dear Dr Salomon Fick:  Thank you for your kind referral of Sherry Nicholson for consultation of the above symptoms. Although her history is well known to you, please allow me to reiterate it for the purpose of our medical record. She is alone in the office today. Records and images were personally reviewed where available.   HISTORY OF PRESENT ILLNESS: This is a pleasant 53 year old right-handed woman with a history of hypertension, migraines, seizures, presenting to establish care. She reports that seizures started around 2014/2015 when she was living in [place]. She was driving to work and started feeling a little woozy. She was told at work that she did not look right and a parent asked her if she had seizures. She recalls saying then then has no recollection of events. She was told she was staring off. She was brought to [facility] at that time, no medications started since it was the first event. A few months later, she started having recurrent episodes that always start with a headache, she feels woozy then breaks into a sweat. She would be told she responds but it is very faded, she was told she was "in [place]," with her lips moving but no words come out. This would then progress to a convulsion. No tongue bite, incontinence, or focal weakness. She would be exhausted and sleepy after. She notes that strong scents can trigger her headaches. She saw a neurologist and was started on Topiramate. She recalls having 3-4 a month from 2016-2017, then had her last episode around 2018/2019. She moved to [facility] in 2021 and since she was doing well, stopped the Topiramate. She was event-free for 7 years until 12/02/23 while traveling for work in [place], [place], she recalls waking up that morning with a  bad headache. She had some orange juice and fruit, got in the car with her co-workers, one of them started using a very strong smelling nail polish. They then noticed she was sweating bad and told her to pull over. She moved to the back seat where she lay back and then started vomiting, followed by a convulsion. She woke up in the ambulance and was told she had a couple back to back. She was brought to [facility] ER, no further seizures witnessed. CBC, CMP normal, head CT no acute changes. She was restarted on Topiramate 50mg  BID.  She denies any further convulsions since 11/2023. She was having less headaches back on the Topiramate but ran out almost a month ago and has been having daily headaches since then with sharp pains that come and go over the temples. They are sore when she presses on them. No further nausea/vomiting. She usually takes Ibuprofen and lays down in a dark room. She has occasional tingling in the left hand. No olfactory/gustatory hallucinations, myoclonic jerks. She has occasional hand tremors more noticeable in the summertime. She was in admitted to [facility] in 2015 with a diagnosis of TIA, she had chest pain, fell to the ground and could not speak, diaphoretic, with BP 168/100. MRI brain on acute changes. She was discharged home on aspirin but stopped this as well. She reports interrupted sleep, she probably averages 4 hours total. She was sleep deprived the night prior to the seizure in January. No alcohol. She travels for work and is home in [place] one  week every month. Memory is not so good.   Her paternal uncle and 2 paternal cousins have seizures. Her sister and mother have migraines. She had a normal birth and early development.  There is no history of febrile convulsions, CNS infections such as meningitis/encephalitis, significant traumatic brain injury, neurosurgical procedures.    PAST MEDICAL HISTORY: Past Medical History:  Diagnosis Date   Daily headache     Hypercholesteremia    Hypertension    Seizures (HCC)    Sleep apnea    Stroke [facility])    TIA (transient ischemic attack) 01/21/2014    PAST SURGICAL HISTORY: Past Surgical History:  Procedure Laterality Date   SHOULDER ARTHROSCOPY W/ ROTATOR CUFF REPAIR Right ~ 2000    MEDICATIONS: Current Outpatient Medications on File Prior to Visit  Medication Sig Dispense Refill   topiramate (TOPAMAX) 50 MG tablet Take 50 mg by mouth 2 (two) times daily.     Current Facility-Administered Medications on File Prior to Visit  Medication Dose Route Frequency Provider Last Rate Last Admin   triamcinolone acetonide (KENALOG) 10 MG/ML injection 10 mg  10 mg Other Once Regal, Norman S, DPM        ALLERGIES: Allergies  Allergen Reactions   Octacosanol Swelling   Coconut Flavoring Agent (Non-Screening) Itching   Cortisone Acetate Itching and Rash    FAMILY HISTORY: Family History  Problem Relation Age of Onset   Colon cancer Neg Hx    Esophageal cancer Neg Hx    Rectal cancer Neg Hx    Stomach cancer Neg Hx     SOCIAL HISTORY: Social History   Socioeconomic History   Marital status: Single    Spouse name: Not on file   Number of children: Not on file   Years of education: Not on file   Highest education level: Not on file  Occupational History   Not on file  Tobacco Use   Smoking status: Never   Smokeless tobacco: Never  Vaping Use   Vaping status: Never Used  Substance and Sexual Activity   Alcohol use: Yes    Comment: 01/21/2014 "might have few drinks 6 times/yr, if that"   Drug use: No   Sexual activity: Yes  Other Topics Concern   Not on file  Social History Narrative   Are you right handed or left handed? Right    Are you currently employed ?  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 Alone    Who lives with you?    What type of home do you live in: 1 story or 2 story? 1 story        Social Drivers of Research scientist (physical sciences) Strain: Not on file  Food Insecurity: Not on file  Transportation Needs: Not on file  Physical Activity: Not on file  Stress: Not on file  Social Connections: Unknown (03/31/2022)   Received from [facility]   Social Network    Social Network: Not on file  Intimate Partner Violence: Unknown (02/20/2022)   Received from [facility]   HITS    Physically Hurt: Not on file    Insult or Talk Down To: Not on file    Threaten Physical Harm: Not on file    Scream or Curse: Not on file     PHYSICAL EXAM: Vitals:   02/10/24 1258 02/10/24 1305  BP: (!) 155/100 (!) 147/91  Pulse: 80   SpO2:  100%    General: No acute distress Head:  Normocephalic/atraumatic Skin/Extremities: No rash, no edema Neurological Exam: Mental status: alert and oriented to person, place, and time, no dysarthria or aphasia, Fund of knowledge is appropriate.  Recent and remote memory are intact, 3/3 delayed recall.  Attention and concentration are normal, 5/5 WORLD backwards.  Cranial nerves: CN I: not tested CN II: pupils equal, round, visual fields intact CN III, IV, VI:  full range of motion, no nystagmus, no ptosis CN V: facial sensation intact CN VII: upper and lower face symmetric CN VIII: hearing intact to conversation Bulk & Tone: normal, no fasciculations. Motor: 5/5 throughout with no pronator drift. Sensation: intact to light touch, cold, pin, vibration sense.  No extinction to double simultaneous stimulation.  Romberg test negative Deep Tendon Reflexes: brisk +2 throughout, +Hoffman sign on right Cerebellar: no incoordination on finger to nose testing Gait: narrow-based and steady, able to tandem walk adequately. Tremor: none   IMPRESSION: This is a pleasant 53 year old right-handed woman with a history of hypertension, migraines, seizures, presenting to establish care. She reports recurrent episodes since 2014/2015 where she would have a bad headache followed by diaphoresis, staring,  then convulsions. She went 7 years seizure-free off medication until 12/02/2023. MRI brain with and without contrast and 1-hour EEG will be ordered. Restart Topiramate 50mg  BID.  [place] driving laws were discussed with the patient, and she knows to stop driving after a seizure, until 6 months seizure-free. BP today elevated, discuss with PCP. She will also discuss anxiety with PCP. Follow-up in 3 months, call for any changes.    Thank you for allowing me to participate in the care of this patient. Please do not hesitate to call for any questions or concerns.   Patrcia Dolly, M.D.  CC: Dr. Salomon Fick

## 2024-02-10 NOTE — Telephone Encounter (Signed)
 Patient dropped off document  staff health assessment , to be filled out by provider. Patient requested to send it back via Call Patient to pick up within 7-days. Document is located in providers tray at front office.Please advise at Mobile [phone] (mobile)

## 2024-02-10 NOTE — Patient Instructions (Signed)
 Good to meet you.  Schedule MRI brain with and without contrast  2. Schedule 1-hour EEG  3. Restart Topiramate (Topamax) 50mg : take 1 tablet twice a day  4. Keep a calendar of your symptoms  5. Follow-up in 3 months, call for any changes   Seizure Precautions: 1. If medication has been prescribed for you to prevent seizures, take it exactly as directed.  Do not stop taking the medicine without talking to your doctor first, even if you have not had a seizure in a long time.   2. Avoid activities in which a seizure would cause danger to yourself or to others.  Don't operate dangerous machinery, swim alone, or climb in high or dangerous places, such as on ladders, roofs, or girders.  Do not drive unless your doctor says you may.  3. If you have any warning that you may have a seizure, lay down in a safe place where you can't hurt yourself.    4.  No driving for 6 months from last seizure, as per [facility].   Please refer to the following link on the Epilepsy Foundation of America's website for more information: http://www.epilepsyfoundation.org/answerplace/Social/driving/drivingu.cfm   5.  Maintain good sleep hygiene.   6.  Contact your doctor if you have any problems that may be related to the medicine you are taking.  7.  Call 911 and bring the patient back to the ED if:        A.  The seizure lasts longer than 5 minutes.       B.  The patient doesn't awaken shortly after the seizure  C.  The patient has new problems such as difficulty seeing, speaking or moving  D.  The patient was injured during the seizure  E.  The patient has a temperature over 102 F (39C)  F.  The patient vomited and now is having trouble breathing

## 2024-02-16 ENCOUNTER — Encounter: Payer: Self-pay | Admitting: Neurology

## 2024-02-16 ENCOUNTER — Other Ambulatory Visit

## 2024-02-16 NOTE — Telephone Encounter (Signed)
 Called patient left a VM to return call, Per Dr. Salomon Fick patient needs to be seen.

## 2024-02-20 ENCOUNTER — Encounter: Payer: Self-pay | Admitting: Family Medicine

## 2024-02-20 ENCOUNTER — Ambulatory Visit: Admitting: Family Medicine

## 2024-02-20 VITALS — BP 142/84 | HR 80 | Temp 97.9°F | Ht 65.0 in | Wt 190.2 lb

## 2024-02-20 DIAGNOSIS — I10 Essential (primary) hypertension: Secondary | ICD-10-CM

## 2024-02-20 DIAGNOSIS — G43009 Migraine without aura, not intractable, without status migrainosus: Secondary | ICD-10-CM | POA: Diagnosis not present

## 2024-02-20 DIAGNOSIS — G40909 Epilepsy, unspecified, not intractable, without status epilepticus: Secondary | ICD-10-CM | POA: Diagnosis not present

## 2024-02-20 MED ORDER — HYDROCHLOROTHIAZIDE 12.5 MG PO TABS: 12.5000 mg | ORAL_TABLET | Freq: Every day | ORAL | 3 refills | Status: AC

## 2024-02-20 NOTE — Progress Notes (Signed)
 Established Patient Office Visit   Subjective  Patient ID: Sherry Nicholson, female    DOB: Nov 02, 1971  Age: 53 y.o. MRN: [id number]  Chief Complaint  Patient presents with   Follow-up    Paperwork and  Hospital follow-up for seizure seen 1/14 last seizure was in 2019, patient is seeing Dr. Karel Jarvis     Patient is a 53 year old female seen for follow-up.  Patient dropped off a health assessment form for her employer but was advised to come into clinic.  Shortly after [facility] October 2024 for CPE patient had a seizure.  Was seen in outside ED.  States developed a migraine and diaphoresis, then had a seizure while in the car with coworkers.  Patient was driving but switched with one of her coworkers when she began feeling sick.  Another coworker in the car began paining her fingernails which is what patient thinks triggered the seizure.  Followed by neurology, Dr. Karel Jarvis.  Topamax restarted.  Patient initially started having seizures in 2014/2015.  Last seizure was in 2019.  Patient was on Topamax 25 mg but stopped the medication shortly after.  Patient notes since restarting medication she has less of an appetite.  Also mentions concern about weight gain.  Patient mentions BP has been elevated at home and at recent office visits.  Is prepared to restart medication.    Patient Active Problem List   Diagnosis Date Noted   Screening for STD (sexually transmitted disease) 09/07/2014   Appetite impaired 09/07/2014   Left arm pain 09/07/2014   Left arm weakness 09/07/2014   Rapid palpitations 03/30/2014   Dyslipidemia 01/23/2014   HTN (hypertension) 01/21/2014   TIA (transient ischemic attack) 01/21/2014   URINALYSIS, ABNORMAL 01/16/2010   Headache 12/21/2009   DYSPEPSIA 10/31/2009   DIZZINESS 10/31/2009   IRREGULAR MENSES 12/12/2008   Essential hypertension 11/01/2008   Past Medical History:  Diagnosis Date   Daily headache    Hypercholesteremia    Hypertension    Seizures (HCC)    Sleep  apnea    TIA (transient ischemic attack) 01/21/2014   Past Surgical History:  Procedure Laterality Date   SHOULDER ARTHROSCOPY W/ ROTATOR CUFF REPAIR Right ~ 2000   Social History   Tobacco Use   Smoking status: Never   Smokeless tobacco: Never  Vaping Use   Vaping status: Never Used  Substance Use Topics   Alcohol use: Yes    Comment: 01/21/2014 "might have few drinks 6 times/yr, if that"   Drug use: No   Family History  Problem Relation Age of Onset   Colon cancer Neg Hx    Esophageal cancer Neg Hx    Rectal cancer Neg Hx    Stomach cancer Neg Hx    Allergies  Allergen Reactions   Octacosanol Swelling   Coconut Flavoring Agent (Non-Screening) Itching   Cortisone Acetate Itching and Rash      ROS Negative unless stated above    Objective:     BP (!) 142/84 (BP Location: Left Arm, Patient Position: Sitting, Cuff Size: Normal)   Pulse 80   Temp 97.9 F (36.6 C) (Oral)   Ht 5\' 5"  (1.651 m)   Wt 190 lb 3.2 oz (86.3 kg)   LMP 01/10/2024 (Approximate)   SpO2 97%   BMI 31.65 kg/m  BP Readings from Last 3 Encounters:  02/20/24 (!) 142/84  02/10/24 (!) 147/91  09/03/23 130/86   Wt Readings from Last 3 Encounters:  02/20/24 190 lb 3.2 oz (86.3 kg)  02/10/24 193 lb (87.5 kg)  12/15/23 175 lb (79.4 kg)      Physical Exam Constitutional:      General: She is not in acute distress.    Appearance: Normal appearance.  HENT:     Head: Normocephalic and atraumatic.     Nose: Nose normal.     Mouth/Throat:     Mouth: Mucous membranes are moist.  Cardiovascular:     Rate and Rhythm: Normal rate and regular rhythm.     Heart sounds: Normal heart sounds. No murmur heard.    No gallop.  Pulmonary:     Effort: Pulmonary effort is normal. No respiratory distress.     Breath sounds: Normal breath sounds. No wheezing, rhonchi or rales.  Skin:    General: Skin is warm and dry.  Neurological:     Mental Status: She is alert and oriented to person, place, and time.        02/20/2024   10:19 AM 08/29/2023    9:51 AM  GAD 7 : Generalized Anxiety Score  Nervous, Anxious, on Edge 1 3  Control/stop worrying 1 3  Worry too much - different things 1 3  Trouble relaxing 0 2  Restless 0 0  Easily annoyed or irritable 0 3  Afraid - awful might happen 0 3  Total GAD 7 Score 3 17  Anxiety Difficulty Not difficult at all Somewhat difficult   No results found for any visits on 02/20/24.    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  Dispense: 90 tablet; Refill: 3  Patient with a history of epilepsy.  Continue follow-up with neurology.  Patient advised to schedule EEG.  Has appointment for MRI.  Continue topiramate 50 mg twice daily.  Discussed driving restrictions.  Patient advised to consider medical alert bracelet.  Given BP elevation discussed restarting medication.  Patient previously on HCTZ 25 mg daily.  Will restart at 12.5 mg.  Patient advised to increase p.o. intake of water and fluids to avoid possible dehydration from diuretic and decreased appetite with topiramate.  Continue monitoring BP at home.  If unable to keep up with fluids discussed starting a different medication.  Health assessment form for patient's job completed.  Return in about 6 weeks (around 04/02/2024).   Sherry Saint, MD

## 2024-03-01 ENCOUNTER — Other Ambulatory Visit

## 2024-03-01 ENCOUNTER — Encounter: Payer: Self-pay | Admitting: Neurology

## 2024-03-12 ENCOUNTER — Encounter: Payer: Self-pay | Admitting: Neurology

## 2024-03-15 ENCOUNTER — Other Ambulatory Visit

## 2024-03-31 ENCOUNTER — Ambulatory Visit: Admitting: Neurology

## 2024-04-13 ENCOUNTER — Ambulatory Visit: Payer: Self-pay

## 2024-04-13 ENCOUNTER — Encounter: Payer: Self-pay | Admitting: Family Medicine

## 2024-04-13 ENCOUNTER — Ambulatory Visit (INDEPENDENT_AMBULATORY_CARE_PROVIDER_SITE_OTHER): Admitting: Family Medicine

## 2024-04-13 VITALS — BP 128/80 | HR 83 | Temp 98.0°F | Wt 192.2 lb

## 2024-04-13 DIAGNOSIS — S76012A Strain of muscle, fascia and tendon of left hip, initial encounter: Secondary | ICD-10-CM | POA: Diagnosis not present

## 2024-04-13 NOTE — Telephone Encounter (Signed)
 Patient has an appt 5/27

## 2024-04-13 NOTE — Progress Notes (Signed)
   Subjective:    Patient ID: Sherry Nicholson, female    DOB: 07-17-1971, 53 y.o.   MRN: [id number]  HPI Here for a sharp pain in the lateral left hip that started suddenly 2 days ago when she stood up from sitting on a couch. She had been on the couch for several hours. She took some Ibuprofen with little relief. Now the hip area is swelling a bit.    Review of Systems  Constitutional: Negative.   Respiratory: Negative.    Cardiovascular: Negative.   Musculoskeletal:  Positive for arthralgias.       Objective:   Physical Exam Constitutional:      Comments: She walks easily but has some pain when getting up on the exam table   Cardiovascular:     Rate and Rhythm: Normal rate and regular rhythm.     Pulses: Normal pulses.     Heart sounds: Normal heart sounds.  Pulmonary:     Effort: Pulmonary effort is normal.     Breath sounds: Normal breath sounds.  Musculoskeletal:     Comments: She is mildly swollen over the lateral left hip. There is no tenderness. She has pain on internal rotation of the left hip as well as abduction of the left hip   Neurological:     Mental Status: She is alert.           Assessment & Plan:  Left hip abductor strain. She will apply ice for 30 minutes TID. She has some Diclofenac  75 mg at home, so she will take this BID as needed. This should heal slowly overt the next few weeks. Recheck as needed. Corita Diego, MD

## 2024-04-13 NOTE — Telephone Encounter (Signed)
 Chief Complaint: Hip pain Symptoms: pain, swelling Frequency: intermittent Pertinent Negatives: Patient denies fever, injury, tingling Disposition: [] ED /[] Urgent Care (no appt availability in office) / [x] Appointment(In office/virtual)/ []  [facility]/ [] Home Care/ [] Refused Recommended Disposition /[] [place] [facility]/ []  Follow-up with PCP Additional Notes:  Left sided hip pain-severe and mild swelling. Sunday was lounging on couch, when she got up around 11pm to go bed she developed an extreme sharp pain in her left hip. She was able to ambulate fine. Sunday morning noticed mild swelling that persists today. Walking feels fine and does not increase pain. Severe pain with turning in bed, standing to sitting. Ibuprofen with no effect. Acute evaluation advised, scheduled with an alternate provider today. Also mentions she took another antiinflammatory previously prescribed for ankle pain, this had no effect, she will bring name of medication with her to appointment. Educated on care advice as documented in protocol, patient verbalized understanding.      Copied from CRM [id number]. Topic: Clinical - Red Word Triage >> Apr 13, 2024  1:33 PM Fonda T wrote: [facility] that prompted transfer to Nurse Triage:Pain in left hip, with swelling, worsens when laying down Reason for Disposition  [1] SEVERE pain (e.g., excruciating, unable to do any normal activities) AND [2] not improved after 2 hours of pain medicine  Protocols used: Hip Pain-A-AH

## 2024-05-18 ENCOUNTER — Ambulatory Visit: Admitting: Neurology
